# Patient Record
Sex: Female | Born: 1955 | Race: Black or African American | Hispanic: No | Marital: Married | Smoking: Never smoker
Health system: Southern US, Community
[De-identification: ages and names within clinical notes are randomized; demographics above are authoritative.]

## PROBLEM LIST (undated history)

## (undated) DIAGNOSIS — J45909 Unspecified asthma, uncomplicated: Secondary | ICD-10-CM

## (undated) DIAGNOSIS — A3790 Whooping cough, unspecified species without pneumonia: Secondary | ICD-10-CM

## (undated) HISTORY — DX: Whooping cough, unspecified species without pneumonia: A37.90

## (undated) HISTORY — DX: Unspecified asthma, uncomplicated: J45.909

---

## 1999-08-12 ENCOUNTER — Other Ambulatory Visit: Admission: RE | Admit: 1999-08-12 | Discharge: 1999-08-12 | Payer: Self-pay | Admitting: *Deleted

## 1999-08-30 ENCOUNTER — Encounter: Admission: RE | Admit: 1999-08-30 | Discharge: 1999-08-30 | Payer: Self-pay | Admitting: Family Medicine

## 1999-08-30 ENCOUNTER — Encounter: Payer: Self-pay | Admitting: Family Medicine

## 2000-10-12 ENCOUNTER — Emergency Department (HOSPITAL_COMMUNITY): Admission: EM | Admit: 2000-10-12 | Discharge: 2000-10-13 | Payer: Self-pay | Admitting: Emergency Medicine

## 2001-01-05 ENCOUNTER — Encounter: Admission: RE | Admit: 2001-01-05 | Discharge: 2001-01-05 | Payer: Self-pay | Admitting: Family Medicine

## 2001-01-05 ENCOUNTER — Encounter: Payer: Self-pay | Admitting: Family Medicine

## 2004-03-29 ENCOUNTER — Other Ambulatory Visit: Admission: RE | Admit: 2004-03-29 | Discharge: 2004-03-29 | Payer: Self-pay | Admitting: Family Medicine

## 2005-04-28 ENCOUNTER — Other Ambulatory Visit: Admission: RE | Admit: 2005-04-28 | Discharge: 2005-04-28 | Payer: Self-pay | Admitting: Family Medicine

## 2005-06-23 ENCOUNTER — Encounter: Admission: RE | Admit: 2005-06-23 | Discharge: 2005-06-23 | Payer: Self-pay | Admitting: Family Medicine

## 2005-12-23 ENCOUNTER — Encounter: Admission: RE | Admit: 2005-12-23 | Discharge: 2005-12-23 | Payer: Self-pay | Admitting: Family Medicine

## 2006-05-05 ENCOUNTER — Other Ambulatory Visit: Admission: RE | Admit: 2006-05-05 | Discharge: 2006-05-05 | Payer: Self-pay | Admitting: Family Medicine

## 2006-07-06 ENCOUNTER — Encounter: Admission: RE | Admit: 2006-07-06 | Discharge: 2006-07-06 | Payer: Self-pay | Admitting: Family Medicine

## 2006-07-15 ENCOUNTER — Emergency Department (HOSPITAL_COMMUNITY): Admission: EM | Admit: 2006-07-15 | Discharge: 2006-07-16 | Payer: Self-pay | Admitting: Emergency Medicine

## 2007-01-08 ENCOUNTER — Encounter: Admission: RE | Admit: 2007-01-08 | Discharge: 2007-01-08 | Payer: Self-pay | Admitting: Internal Medicine

## 2007-06-08 ENCOUNTER — Other Ambulatory Visit: Admission: RE | Admit: 2007-06-08 | Discharge: 2007-06-08 | Payer: Self-pay | Admitting: Family Medicine

## 2008-06-25 ENCOUNTER — Other Ambulatory Visit: Admission: RE | Admit: 2008-06-25 | Discharge: 2008-06-25 | Payer: Self-pay | Admitting: Family Medicine

## 2008-12-22 ENCOUNTER — Emergency Department (HOSPITAL_COMMUNITY): Admission: EM | Admit: 2008-12-22 | Discharge: 2008-12-22 | Payer: Self-pay | Admitting: Family Medicine

## 2009-07-30 ENCOUNTER — Other Ambulatory Visit: Admission: RE | Admit: 2009-07-30 | Discharge: 2009-07-30 | Payer: Self-pay | Admitting: Family Medicine

## 2010-09-21 ENCOUNTER — Other Ambulatory Visit (HOSPITAL_COMMUNITY)
Admission: RE | Admit: 2010-09-21 | Discharge: 2010-09-21 | Disposition: A | Discharge status: Home / Self Care | Payer: Self-pay | Source: Ambulatory Visit | Attending: Family Medicine | Admitting: Family Medicine

## 2010-09-21 ENCOUNTER — Other Ambulatory Visit: Payer: Self-pay | Admitting: Physician Assistant

## 2010-09-21 DIAGNOSIS — Z124 Encounter for screening for malignant neoplasm of cervix: Secondary | ICD-10-CM | POA: Insufficient documentation

## 2012-05-10 ENCOUNTER — Other Ambulatory Visit: Payer: Self-pay | Admitting: Family Medicine

## 2012-05-10 DIAGNOSIS — E041 Nontoxic single thyroid nodule: Secondary | ICD-10-CM

## 2012-05-14 ENCOUNTER — Ambulatory Visit
Admission: RE | Admit: 2012-05-14 | Discharge: 2012-05-14 | Disposition: A | Discharge status: Home / Self Care | Payer: 59 | Source: Ambulatory Visit | Attending: Family Medicine | Admitting: Family Medicine

## 2012-05-14 DIAGNOSIS — E041 Nontoxic single thyroid nodule: Secondary | ICD-10-CM

## 2012-05-25 ENCOUNTER — Other Ambulatory Visit: Payer: Self-pay | Admitting: Family Medicine

## 2012-05-25 DIAGNOSIS — R748 Abnormal levels of other serum enzymes: Secondary | ICD-10-CM

## 2012-05-28 ENCOUNTER — Ambulatory Visit
Admission: RE | Admit: 2012-05-28 | Discharge: 2012-05-28 | Disposition: A | Discharge status: Home / Self Care | Payer: 59 | Source: Ambulatory Visit | Attending: Family Medicine | Admitting: Family Medicine

## 2012-05-28 DIAGNOSIS — R748 Abnormal levels of other serum enzymes: Secondary | ICD-10-CM

## 2012-08-13 ENCOUNTER — Other Ambulatory Visit: Payer: Self-pay | Admitting: Family Medicine

## 2012-08-13 ENCOUNTER — Ambulatory Visit
Admission: RE | Admit: 2012-08-13 | Discharge: 2012-08-13 | Disposition: A | Discharge status: Home / Self Care | Payer: 59 | Source: Ambulatory Visit | Attending: Family Medicine | Admitting: Family Medicine

## 2012-08-13 DIAGNOSIS — R52 Pain, unspecified: Secondary | ICD-10-CM

## 2012-10-02 ENCOUNTER — Other Ambulatory Visit: Payer: Self-pay | Admitting: Gastroenterology

## 2012-10-02 DIAGNOSIS — R109 Unspecified abdominal pain: Secondary | ICD-10-CM

## 2012-10-05 ENCOUNTER — Ambulatory Visit
Admission: RE | Admit: 2012-10-05 | Discharge: 2012-10-05 | Disposition: A | Discharge status: Home / Self Care | Payer: 59 | Source: Ambulatory Visit | Attending: Gastroenterology | Admitting: Gastroenterology

## 2013-01-02 ENCOUNTER — Other Ambulatory Visit: Payer: Self-pay | Admitting: Family Medicine

## 2013-01-02 ENCOUNTER — Ambulatory Visit
Admission: RE | Admit: 2013-01-02 | Discharge: 2013-01-02 | Disposition: A | Discharge status: Home / Self Care | Payer: 59 | Source: Ambulatory Visit | Attending: Family Medicine | Admitting: Family Medicine

## 2013-01-02 DIAGNOSIS — R748 Abnormal levels of other serum enzymes: Secondary | ICD-10-CM

## 2013-12-31 ENCOUNTER — Other Ambulatory Visit (HOSPITAL_COMMUNITY)
Admission: RE | Admit: 2013-12-31 | Discharge: 2013-12-31 | Disposition: A | Discharge status: Home / Self Care | Payer: BC Managed Care – PPO | Source: Ambulatory Visit | Attending: Family Medicine | Admitting: Family Medicine

## 2013-12-31 ENCOUNTER — Other Ambulatory Visit: Payer: Self-pay | Admitting: Family Medicine

## 2013-12-31 DIAGNOSIS — Z124 Encounter for screening for malignant neoplasm of cervix: Secondary | ICD-10-CM | POA: Insufficient documentation

## 2013-12-31 DIAGNOSIS — E041 Nontoxic single thyroid nodule: Secondary | ICD-10-CM

## 2013-12-31 DIAGNOSIS — Z113 Encounter for screening for infections with a predominantly sexual mode of transmission: Secondary | ICD-10-CM | POA: Insufficient documentation

## 2013-12-31 DIAGNOSIS — Z1151 Encounter for screening for human papillomavirus (HPV): Secondary | ICD-10-CM | POA: Insufficient documentation

## 2014-01-06 ENCOUNTER — Other Ambulatory Visit: Payer: 59

## 2014-01-07 ENCOUNTER — Encounter (INDEPENDENT_AMBULATORY_CARE_PROVIDER_SITE_OTHER): Payer: Self-pay

## 2014-01-07 ENCOUNTER — Ambulatory Visit
Admission: RE | Admit: 2014-01-07 | Discharge: 2014-01-07 | Disposition: A | Discharge status: Home / Self Care | Payer: BC Managed Care – PPO | Source: Ambulatory Visit | Attending: Family Medicine | Admitting: Family Medicine

## 2014-01-07 DIAGNOSIS — E041 Nontoxic single thyroid nodule: Secondary | ICD-10-CM

## 2014-07-07 ENCOUNTER — Ambulatory Visit
Admission: RE | Admit: 2014-07-07 | Discharge: 2014-07-07 | Disposition: A | Discharge status: Home / Self Care | Payer: BC Managed Care – PPO | Source: Ambulatory Visit | Attending: Family Medicine | Admitting: Family Medicine

## 2014-07-07 ENCOUNTER — Other Ambulatory Visit: Payer: Self-pay | Admitting: Family Medicine

## 2014-07-07 DIAGNOSIS — R05 Cough: Secondary | ICD-10-CM

## 2014-07-07 DIAGNOSIS — R059 Cough, unspecified: Secondary | ICD-10-CM

## 2015-01-07 ENCOUNTER — Other Ambulatory Visit: Payer: Self-pay | Admitting: Family Medicine

## 2015-01-07 ENCOUNTER — Other Ambulatory Visit (HOSPITAL_COMMUNITY)
Admission: RE | Admit: 2015-01-07 | Discharge: 2015-01-07 | Disposition: A | Discharge status: Home / Self Care | Payer: BLUE CROSS/BLUE SHIELD | Source: Ambulatory Visit | Attending: Family Medicine | Admitting: Family Medicine

## 2015-01-07 DIAGNOSIS — Z124 Encounter for screening for malignant neoplasm of cervix: Secondary | ICD-10-CM | POA: Diagnosis not present

## 2015-01-07 DIAGNOSIS — Z1151 Encounter for screening for human papillomavirus (HPV): Secondary | ICD-10-CM | POA: Insufficient documentation

## 2015-01-07 DIAGNOSIS — Z113 Encounter for screening for infections with a predominantly sexual mode of transmission: Secondary | ICD-10-CM | POA: Diagnosis present

## 2015-01-09 LAB — CYTOLOGY - PAP

## 2016-09-08 DIAGNOSIS — J069 Acute upper respiratory infection, unspecified: Secondary | ICD-10-CM | POA: Diagnosis not present

## 2016-09-08 DIAGNOSIS — E11319 Type 2 diabetes mellitus with unspecified diabetic retinopathy without macular edema: Secondary | ICD-10-CM | POA: Diagnosis not present

## 2016-09-08 DIAGNOSIS — E782 Mixed hyperlipidemia: Secondary | ICD-10-CM | POA: Diagnosis not present

## 2016-11-23 DIAGNOSIS — Z1231 Encounter for screening mammogram for malignant neoplasm of breast: Secondary | ICD-10-CM | POA: Diagnosis not present

## 2016-12-07 DIAGNOSIS — E782 Mixed hyperlipidemia: Secondary | ICD-10-CM | POA: Diagnosis not present

## 2017-03-13 DIAGNOSIS — J069 Acute upper respiratory infection, unspecified: Secondary | ICD-10-CM | POA: Diagnosis not present

## 2017-04-07 ENCOUNTER — Other Ambulatory Visit: Payer: Self-pay | Admitting: Family Medicine

## 2017-04-07 ENCOUNTER — Ambulatory Visit
Admission: RE | Admit: 2017-04-07 | Discharge: 2017-04-07 | Disposition: A | Discharge status: Home / Self Care | Payer: 59 | Source: Ambulatory Visit | Attending: Family Medicine | Admitting: Family Medicine

## 2017-04-07 DIAGNOSIS — R05 Cough: Secondary | ICD-10-CM

## 2017-04-07 DIAGNOSIS — Z7984 Long term (current) use of oral hypoglycemic drugs: Secondary | ICD-10-CM | POA: Diagnosis not present

## 2017-04-07 DIAGNOSIS — R059 Cough, unspecified: Secondary | ICD-10-CM

## 2017-04-07 DIAGNOSIS — I110 Hypertensive heart disease with heart failure: Secondary | ICD-10-CM | POA: Diagnosis not present

## 2017-04-07 DIAGNOSIS — R1310 Dysphagia, unspecified: Secondary | ICD-10-CM

## 2017-04-07 DIAGNOSIS — Z0000 Encounter for general adult medical examination without abnormal findings: Secondary | ICD-10-CM | POA: Diagnosis not present

## 2017-04-07 DIAGNOSIS — E113213 Type 2 diabetes mellitus with mild nonproliferative diabetic retinopathy with macular edema, bilateral: Secondary | ICD-10-CM | POA: Diagnosis not present

## 2017-04-14 ENCOUNTER — Ambulatory Visit
Admission: RE | Admit: 2017-04-14 | Discharge: 2017-04-14 | Disposition: A | Discharge status: Home / Self Care | Payer: 59 | Source: Ambulatory Visit | Attending: Family Medicine | Admitting: Family Medicine

## 2017-04-14 DIAGNOSIS — R1310 Dysphagia, unspecified: Secondary | ICD-10-CM | POA: Diagnosis not present

## 2017-04-24 DIAGNOSIS — K222 Esophageal obstruction: Secondary | ICD-10-CM | POA: Diagnosis not present

## 2017-04-24 DIAGNOSIS — R933 Abnormal findings on diagnostic imaging of other parts of digestive tract: Secondary | ICD-10-CM | POA: Diagnosis not present

## 2017-04-24 DIAGNOSIS — R1310 Dysphagia, unspecified: Secondary | ICD-10-CM | POA: Diagnosis not present

## 2017-05-10 ENCOUNTER — Telehealth: Payer: Self-pay | Admitting: Pulmonary Disease

## 2017-05-10 NOTE — Telephone Encounter (Signed)
Margie----[facility] called and stated that they are faxing over the records that you requested for this pt. Just FYI

## 2017-05-11 ENCOUNTER — Ambulatory Visit (INDEPENDENT_AMBULATORY_CARE_PROVIDER_SITE_OTHER): Payer: 59 | Admitting: Pulmonary Disease

## 2017-05-11 ENCOUNTER — Encounter: Payer: Self-pay | Admitting: Pulmonary Disease

## 2017-05-11 ENCOUNTER — Ambulatory Visit (INDEPENDENT_AMBULATORY_CARE_PROVIDER_SITE_OTHER)
Admission: RE | Admit: 2017-05-11 | Discharge: 2017-05-11 | Disposition: A | Discharge status: Home / Self Care | Payer: 59 | Source: Ambulatory Visit | Attending: Pulmonary Disease | Admitting: Pulmonary Disease

## 2017-05-11 VITALS — BP 130/80 | HR 83 | Ht 63.5 in | Wt 195.6 lb

## 2017-05-11 DIAGNOSIS — R059 Cough, unspecified: Secondary | ICD-10-CM

## 2017-05-11 DIAGNOSIS — R05 Cough: Secondary | ICD-10-CM | POA: Diagnosis not present

## 2017-05-11 DIAGNOSIS — Z23 Encounter for immunization: Secondary | ICD-10-CM | POA: Diagnosis not present

## 2017-05-11 LAB — NITRIC OXIDE: NITRIC OXIDE: 8

## 2017-05-11 MED ORDER — CHLORPHENIRAMINE MALEATE 4 MG PO TABS: 8.0000 mg | ORAL_TABLET | Freq: Three times a day (TID) | ORAL | 2 refills | Status: DC

## 2017-05-11 MED ORDER — AZELASTINE HCL 0.1 % NA SOLN: 2.0000 | Freq: Two times a day (BID) | NASAL | 6 refills | Status: DC

## 2017-05-11 NOTE — Patient Instructions (Addendum)
We'll start chlorpheniramine 8 mg 3 times daily and Astelin nasal spray Continue Protonix and follow up with GI for EGD Continue the Symbicort. You need to take 2 puffs 2 times daily for it to have maximum affect  We'll get a chest x-ray and pulmonary function tests for further evaluation of her lung function Follow-up in 6-8 weeks.

## 2017-05-11 NOTE — Progress Notes (Signed)
TKAI LARGE    [id number]    01/24/1956  Primary Care Physician:Shaw, Rockney Ghee, MD  Referring Physician: Lupita Raider, MD [address] [place], [place] [address]   Chief complaint:  Consult for evaluation of cough  HPI: 61 year old with past medical history of hypertension, hyperlipidemia, diabetes. She has complains of cough for the past 2-3 years. This is nonproductive in nature and is most evident while eating. Denies any dyspnea, wheezing.she has symptoms of allergic rhinitis with postnasal drip, GERD. She had a recent evaluation with barium swallow which showed stricture and is scheduled for EGD with [facility] next week. Started on Protonix for her GERD She is currently on Symbicort which she uses as needed but does not know if this has improved her breathing.   Pets: none Occupation:Shift manager at [employer]  Exposures:no known exposures  Smoking history:None Travel History:No recent travel   Outpatient Encounter Prescriptions as of 05/11/2017  Medication Sig  . amLODipine (NORVASC) 10 MG tablet Take 10 mg by mouth daily.  Marland Kitchen atorvastatin (LIPITOR) 80 MG tablet Take 80 mg by mouth daily.  . budesonide-formoterol (SYMBICORT) 160-4.5 MCG/ACT inhaler Inhale 2 puffs into the lungs 2 (two) times daily.  . metFORMIN (GLUCOPHAGE) 1000 MG tablet Take 1,000 mg by mouth 2 (two) times daily with a meal.   No facility-administered encounter medications on file as of 05/11/2017.     Allergies as of 05/11/2017  . (Not on File)    Past Medical History:  Diagnosis Date  . Asthma   . Whooping cough     History reviewed. No pertinent surgical history.  Family History  Problem Relation Age of Onset  . Hypertension Mother   . Hypertension Father   . Hypertension Sister     Social History   Social History  . Marital status: Married    Spouse name: N/A  . Number of children: N/A  . Years of education: N/A   Occupational History  . Not on  file.   Social History Main Topics  . Smoking status: Never Smoker  . Smokeless tobacco: Never Used  . Alcohol use No  . Drug use: No  . Sexual activity: Not on file   Other Topics Concern  . Not on file   Social History Narrative  . No narrative on file   Review of systems: Review of Systems  Constitutional: Negative for fever and chills.  HENT: Negative.   Eyes: Negative for blurred vision.  Respiratory: as per HPI  Cardiovascular: Negative for chest pain and palpitations.  Gastrointestinal: Negative for vomiting, diarrhea, blood per rectum. Genitourinary: Negative for dysuria, urgency, frequency and hematuria.  Musculoskeletal: Negative for myalgias, back pain and joint pain.  Skin: Negative for itching and rash.  Neurological: Negative for dizziness, tremors, focal weakness, seizures and loss of consciousness.  Endo/Heme/Allergies: Negative for environmental allergies.  Psychiatric/Behavioral: Negative for depression, suicidal ideas and hallucinations.  All other systems reviewed and are negative.  Physical Exam: Blood pressure 130/80, pulse 83, height 5' 3.5" (1.613 m), weight 88.7 kg (195 lb 9.6 oz), SpO2 98 %. Gen:      No acute distress HEENT:  EOMI, sclera anicteric Neck:     No masses; no thyromegaly Lungs:    Clear to auscultation bilaterally; normal respiratory effort CV:         Regular rate and rhythm; no murmurs Abd:      + bowel sounds; soft, non-tender; no palpable masses,  no distension Ext:    No edema; adequate peripheral perfusion Skin:      Warm and dry; no rash Neuro: alert and oriented x 3 Psych: normal mood and affect  Data Reviewed: Chest x-ray 04/07/17-no acute cardiopulmonary abnormality.  Barium swallow 04/14/17- Stasis of 13 mm barium tablet in the distal esophagus, compatible with stricture. No abrupt shouldering or mucosal irregularity seen on single or double contrast imaging to raise concern for neoplasm. Upper endoscopy may prove  helpful to further evaluate.  Tiny Sliding type hiatal hernia.  FENO- 8 Assessment:  Evaluation for chronic cough Suspect upper airway cough from GERD and esophageal stricture. She may have a component of postnasal drip, rhinitis. Suspicion for asthma as low as symptoms are not very typical and FENO is normal  She'll continue on the Symbicort. I have asked her to take 2 puffs twice daily for maximum effect. Evaluate with chest x-ray, PFTs with bronchial dilator response. For postnasal drip start chlorpheniramine antihistamine 8 mg 3 times daily and Astelin nasal spray For GERD and esophageal stricture she will continue protonix and has follow up with GI. I educated her on behavioral changes to deal with cough including conscious suppression of the urge to cough, use of throat lozenges.  Plan/Recommendations: - Continue Symbicort. Use 2 puffs twice daily - Check chest x-ray, PFTs - Start chlorpheniramine 8 mg 3 times daily and Astelin nasal spray - Continue Protonix, follow up with GI  Chilton Greathouse MD [facility] Pager [phone] 05/11/2017, 4:34 PM

## 2017-05-11 NOTE — Telephone Encounter (Addendum)
It does not appear that records were received. I have spoken with karen at [facility] and requested that records be re faxed.

## 2017-05-12 DIAGNOSIS — Z23 Encounter for immunization: Secondary | ICD-10-CM

## 2017-05-12 DIAGNOSIS — R05 Cough: Secondary | ICD-10-CM | POA: Diagnosis not present

## 2017-05-19 DIAGNOSIS — K2950 Unspecified chronic gastritis without bleeding: Secondary | ICD-10-CM | POA: Diagnosis not present

## 2017-05-19 DIAGNOSIS — R1310 Dysphagia, unspecified: Secondary | ICD-10-CM | POA: Diagnosis not present

## 2017-05-19 DIAGNOSIS — R933 Abnormal findings on diagnostic imaging of other parts of digestive tract: Secondary | ICD-10-CM | POA: Diagnosis not present

## 2017-06-08 ENCOUNTER — Ambulatory Visit (INDEPENDENT_AMBULATORY_CARE_PROVIDER_SITE_OTHER): Payer: 59 | Admitting: Pulmonary Disease

## 2017-06-08 ENCOUNTER — Encounter: Payer: Self-pay | Admitting: Pulmonary Disease

## 2017-06-08 VITALS — BP 132/78 | HR 75 | Ht 63.5 in | Wt 194.0 lb

## 2017-06-08 DIAGNOSIS — R059 Cough, unspecified: Secondary | ICD-10-CM

## 2017-06-08 DIAGNOSIS — R05 Cough: Secondary | ICD-10-CM | POA: Diagnosis not present

## 2017-06-08 LAB — PULMONARY FUNCTION TEST
DL/VA % pred: 105 %
DL/VA: 4.93 ml/min/mmHg/L
DLCO cor % pred: 66 %
DLCO cor: 15.13 ml/min/mmHg
DLCO unc % pred: 67 %
DLCO unc: 15.4 ml/min/mmHg
FEF 25-75 PRE: 3.17 L/s
FEF 25-75 Post: 1.58 L/sec
FEF2575-%CHANGE-POST: -50 %
FEF2575-%Pred-Post: 77 %
FEF2575-%Pred-Pre: 156 %
FEV1-%CHANGE-POST: -16 %
FEV1-%PRED-PRE: 89 %
FEV1-%Pred-Post: 74 %
FEV1-POST: 1.51 L
FEV1-PRE: 1.8 L
FEV1FVC-%Change-Post: -5 %
FEV1FVC-%Pred-Pre: 116 %
FEV6-%Change-Post: -11 %
FEV6-%PRED-PRE: 77 %
FEV6-%Pred-Post: 68 %
FEV6-Post: 1.7 L
FEV6-Pre: 1.92 L
FEV6FVC-%PRED-PRE: 104 %
FEV6FVC-%Pred-Post: 104 %
FVC-%Change-Post: -11 %
FVC-%PRED-POST: 66 %
FVC-%Pred-Pre: 75 %
FVC-Post: 1.7 L
FVC-Pre: 1.93 L
POST FEV1/FVC RATIO: 89 %
PRE FEV6/FVC RATIO: 100 %
Post FEV6/FVC ratio: 100 %
Pre FEV1/FVC ratio: 94 %
RV % PRED: 59 %
RV: 1.14 L
TLC % pred: 76 %
TLC: 3.74 L

## 2017-06-08 MED ORDER — ALBUTEROL SULFATE HFA 108 (90 BASE) MCG/ACT IN AERS: 2.0000 | INHALATION_SPRAY | Freq: Four times a day (QID) | RESPIRATORY_TRACT | 2 refills | Status: DC | PRN

## 2017-06-08 NOTE — Patient Instructions (Signed)
PFT done today. 

## 2017-06-08 NOTE — Progress Notes (Signed)
Megan Nicholson    [id number]    11/25/1955  Primary Care Physician:Shaw, Rockney Ghee, MD  Referring Physician: Lupita Raider, MD [address] [place], [place] [address]   Chief complaint: Follow-up for cough  HPI: 61 year old with past medical history of hypertension, hyperlipidemia, diabetes. She has complains of cough for the past 2-3 years. This is nonproductive in nature and is most evident while eating. Denies any dyspnea, wheezing.she has symptoms of allergic rhinitis with postnasal drip, GERD. She had a recent evaluation with barium swallow which showed stricture.  She had an EGD by [facility] this month which showed benign esophageal stricture with gastritis.  She underwent dilation and is on omeprazole for acid suppression. Continues to use the Symbicort which she takes as needed.  She does not know if this is helping with her cough.  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 amLODipine (NORVASC) 10 MG tablet Take 10 mg by mouth daily.  Marland Kitchen atorvastatin (LIPITOR) 80 MG tablet Take 80 mg by mouth daily.  Marland Kitchen azelastine (ASTELIN) 0.1 % nasal spray Place 2 sprays into both nostrils 2 (two) times daily. Use in each nostril as directed  . budesonide-formoterol (SYMBICORT) 160-4.5 MCG/ACT inhaler Inhale 2 puffs into the lungs 2 (two) times daily.  . chlorpheniramine (CHLOR-TRIMETON) 4 MG tablet Take 2 tablets (8 mg total) by mouth 3 (three) times daily.  . metFORMIN (GLUCOPHAGE) 1000 MG tablet Take 1,000 mg by mouth 2 (two) times daily with a meal.   No facility-administered encounter medications on file as of 06/08/2017.     Allergies as of 06/08/2017  . (Not on File)    Past Medical History:  Diagnosis Date  . Asthma   . Whooping cough     No past surgical history on file.  Family  History  Problem Relation Age of Onset  . Hypertension Mother   . Hypertension Father   . Hypertension Sister     Social History   Social History  . Marital status: Married    Spouse name: N/A  . Number of children: N/A  . Years of education: N/A   Occupational History  . Not on file.   Social History Main Topics  . Smoking status: Never Smoker  . Smokeless tobacco: Never Used  . Alcohol use No  . Drug use: No  . Sexual activity: Not on file   Other Topics Concern  . Not on file   Social History Narrative  . No narrative on file   Review of systems: Review of Systems  Constitutional: Negative for fever and chills.  HENT: Negative.   Eyes: Negative for blurred vision.  Respiratory: as per HPI  Cardiovascular: Negative for chest pain and palpitations.  Gastrointestinal: Negative for vomiting, diarrhea, blood per rectum. Genitourinary: Negative for dysuria, urgency, frequency and hematuria.  Musculoskeletal: Negative for myalgias, back pain and joint pain.  Skin: Negative for itching and rash.  Neurological: Negative for dizziness, tremors, focal weakness, seizures and loss of consciousness.  Endo/Heme/Allergies: Negative for environmental allergies.  Psychiatric/Behavioral: Negative for depression, suicidal ideas and hallucinations.  All other systems reviewed and are negative.  Physical Exam: Blood pressure 132/78, pulse 75, height 5' 3.5" (1.613 m), weight 194 lb (88 kg), SpO2 95 %. Gen:      No acute distress HEENT:  EOMI,  sclera anicteric Neck:     No masses; no thyromegaly Lungs:    Clear to auscultation bilaterally; normal respiratory effort CV:         Regular rate and rhythm; no murmurs Abd:      + bowel sounds; soft, non-tender; no palpable masses, no distension Ext:    No edema; adequate peripheral perfusion Skin:      Warm and dry; no rash Neuro: alert and oriented x 3 Psych: normal mood and affect  Data Reviewed: Chest x-ray 04/07/17-no acute  cardiopulmonary abnormality.   Chest x-ray 05/11/17-no acute cardiopulmonary abnormality Reviewed images personally  Barium swallow 04/14/17- Stasis of 13 mm barium tablet in the distal esophagus, compatible with stricture. No abrupt shouldering or mucosal irregularity seen on single or double contrast imaging to raise concern for neoplasm. Upper endoscopy may prove helpful to further evaluate.  Tiny Sliding type hiatal hernia.  FENO 05/11/17- 8  EGD 05/19/17 Normal larynx, hiatal hernia Benign esophageal stenosis-dilated, chronic gastritis  PFTs 06/08/17 FVC 1.7 [6 6%], FEV1 1.51 [94%], F/F 89, TLC 76%, DLCO 67%, DLCO/VA 105% Minimal restriction, reduced diffusion capacity which corrects for alveolar volume  Assessment:  Evaluation for chronic cough Suspect upper airway cough from GERD and esophageal stricture. She may have a component of postnasal drip, rhinitis. Suspicion for asthma as low as symptoms are not very typical and FENO is normal. PFTs reviewed.  There is no evidence of obstruction or bronchodilator response.  There was minimal restriction and diffusion impairment that corrects for alveolar volume.  I suspect this is secondary to her body habitus.  There is no evidence of interstitial lung disease on chest x-ray  I do not believe she needs to be on Symbicort.  Will stop that and give her albuterol inhaler to be used PRN For postnasal drip continue chlorpheniramine antihistamine 8 mg 3 times daily and Astelin nasal spray For GERD and esophageal stricture she will continue protonix and has follow up with GI. I educated her on behavioral changes to deal with cough including conscious suppression of the urge to cough, use of throat lozenges.  Plan/Recommendations: - Stop Symbicort, albuterol as needed - Chlorphentermine, Astelin nasal spray - Continue Protonix, follow up with GI  Chilton GreathousePraveen Emersynn Deatley MD [facility] Pager [phone] 06/08/2017, 2:08  PM

## 2017-06-08 NOTE — Patient Instructions (Addendum)
We will stop the Symbicort Will start you on albuterol inhaler as needed Continue on Astelin and chlorphentermine Return in 6 months.

## 2017-07-05 DIAGNOSIS — H2513 Age-related nuclear cataract, bilateral: Secondary | ICD-10-CM | POA: Diagnosis not present

## 2017-07-05 DIAGNOSIS — E119 Type 2 diabetes mellitus without complications: Secondary | ICD-10-CM | POA: Diagnosis not present

## 2017-07-06 DIAGNOSIS — R1310 Dysphagia, unspecified: Secondary | ICD-10-CM | POA: Diagnosis not present

## 2017-07-13 ENCOUNTER — Other Ambulatory Visit: Payer: Self-pay

## 2017-07-13 ENCOUNTER — Ambulatory Visit (INDEPENDENT_AMBULATORY_CARE_PROVIDER_SITE_OTHER): Payer: 59

## 2017-07-13 ENCOUNTER — Ambulatory Visit (INDEPENDENT_AMBULATORY_CARE_PROVIDER_SITE_OTHER): Payer: 59 | Admitting: Podiatry

## 2017-07-13 ENCOUNTER — Encounter: Payer: Self-pay | Admitting: Podiatry

## 2017-07-13 DIAGNOSIS — M722 Plantar fascial fibromatosis: Secondary | ICD-10-CM | POA: Diagnosis not present

## 2017-07-13 DIAGNOSIS — M779 Enthesopathy, unspecified: Secondary | ICD-10-CM

## 2017-07-13 MED ORDER — IBUPROFEN 600 MG PO TABS: 600.0000 mg | ORAL_TABLET | Freq: Three times a day (TID) | ORAL | 0 refills | Status: DC | PRN

## 2017-07-13 NOTE — Patient Instructions (Signed)

## 2017-07-13 NOTE — Progress Notes (Signed)
Subjective:    Patient ID: Megan Nicholson, female    DOB: 10/07/1955, 61 y.o.   MRN: [id number]  HPI  Chief Complaint  Patient presents with  . Foot Pain    B/L bottom of heel pain and Left dorsal of foot x 3-4 weeks   Megan Nicholson presents the office today for concerns of pain to the bottom of her heel with a right side more than the left as well as pain in the top of the left foot which is been ongoing for 3-4 weeks of the heel pain is been ongoing for longer.  She denies any numbness or tingling.  She describes a sharp pain in the bottom of her heel on the right side.  She has minimal pain to the left side of the heel.  She has taken 800 mg ibuprofen without any significant improvement.  She is allergic to meloxicam but she can take other anti-inflammatories without issue.  The pain does not wake her up at night to her feet.  The pain in her heels worse in the morning when she gets up or after periods of rest but also after walking all day.  She is also been soaking her feet and taken Tylenol without much help.  She has no other concerns today.  She denies any claudication symptoms.     Review of Systems  All other systems reviewed and are negative.  Past Medical History:  Diagnosis Date  . Asthma   . Whooping cough     No past surgical history on file.   Current Outpatient Medications:  .  albuterol (PROVENTIL HFA;VENTOLIN HFA) 108 (90 Base) MCG/ACT inhaler, Inhale 2 puffs into the lungs every 6 (six) hours as needed for wheezing or shortness of breath., Disp: 1 Inhaler, Rfl: 2 .  amLODipine (NORVASC) 10 MG tablet, Take 10 mg by mouth daily., Disp: , Rfl: 2 .  atorvastatin (LIPITOR) 80 MG tablet, Take 80 mg by mouth daily., Disp: , Rfl: 2 .  azelastine (ASTELIN) 0.1 % nasal spray, Place 2 sprays into both nostrils 2 (two) times daily. Use in each nostril as directed, Disp: 30 mL, Rfl: 6 .  chlorpheniramine (CHLOR-TRIMETON) 4 MG tablet, Take 2 tablets (8 mg total) by mouth 3  (three) times daily., Disp: 120 tablet, Rfl: 2 .  metFORMIN (GLUCOPHAGE) 1000 MG tablet, Take 1,000 mg by mouth 2 (two) times daily with a meal., Disp: , Rfl:  .  ibuprofen (ADVIL,MOTRIN) 600 MG tablet, Take 1 tablet (600 mg total) by mouth every 8 (eight) hours as needed., Disp: 30 tablet, Rfl: 0  Allergies  Allergen Reactions  . Meloxicam Hives    Social History   Socioeconomic History  . Marital status: Married    Spouse name: Not on file  . Number of children: Not on file  . Years of education: Not on file  . Highest education level: Not on file  Social Needs  . Financial resource strain: Not on file  . Food insecurity - worry: Not on file  . Food insecurity - inability: Not on file  . Transportation needs - medical: Not on file  . Transportation needs - non-medical: Not on file  Occupational History  . Not on file  Tobacco Use  . Smoking status: Never Smoker  . Smokeless tobacco: Never Used  Substance and Sexual Activity  . Alcohol use: No  . Drug use: No  . Sexual activity: Not on file  Other Topics Concern  . Not  on file  Social History Narrative  . Not on file         Objective:   Physical Exam  General: AAO x3, NAD  Dermatological: Skin is warm, dry and supple bilateral. Nails x 10 are well manicured; remaining integument appears unremarkable at this time. There are no open sores, no preulcerative lesions, no rash or signs of infection present.  Vascular: Dorsalis Pedis artery and Posterior Tibial artery pedal pulses are 2/4 bilateral with immedate capillary fill time. Pedal hair growth present. There is no pain with calf compression, swelling, warmth, erythema.   Neruologic: Grossly intact via light touch bilateral. Vibratory intact via tuning fork bilateral. Protective threshold with Semmes Wienstein monofilament intact to all pedal sites bilateral.  Negative Tinel sign is present.  Musculoskeletal:Tenderness to palpation along the plantar medial  tubercle of the calcaneus at the insertion of plantar fascia on the right there is no significant tenderness palpation of the left plantar heel on the plantar fascia.  Foot. There is no pain along the course of the plantar fascia within the arch of the foot. Plantar fascia appears to be intact. There is no pain with lateral compression of the calcaneus or pain with vibratory sensation. There is no pain along the course or insertion of the achilles tendon.  Mild discomfort to the left foot along the midfoot on the Lisfranc joint.  There is no area pinpoint tenderness to this but there is no overlying edema, erythema, increase in warmth.  No other areas of tenderness to bilateral lower extremities. Muscular strength 5/5 in all groups tested bilateral.  Gait: Unassisted, Nonantalgic.      Assessment & Plan:  61 year old female with left dorsal midfoot pain, right heel pain likely plantar fasciitis -Treatment options discussed including all alternatives, risks, and complications -Etiology of symptoms were discussed -X-rays were obtained and reviewed with the patient.  No evidence of acute fracture or stress fracture identified.  Hammertoes are present bilaterally as well as calcaneal spurring.  Arthritic changes of the first MPJ in the left foot.  Right heel pain, plantar fasciitis.  -Injection was performed.  See procedure below. -Ibuprofen prescribed. -Stretching, icing exercises daily -Plantar fascial brace was dispensed -Discussed shoe gear modifications and orthotics  Procedure: Injection Tendon/Ligament Discussed alternatives, risks, complications and verbal consent was obtained.  Location: Right plantar fascia at the glabrous junction; medial approach. Skin Prep: Alcohol. Injectate: 1 cc 0.5% marcaine plain, 1 cc 0.5% Marcaine plain and, 1 cc kenalog 10. Disposition: Patient tolerated procedure well. Injection site dressed with a band-aid.  Post-injection care was discussed and return  precautions discussed.    Left midfoot pain; likely capsulitis -Ibuprofen -Discussed supportive shoes and inserts as well for this.

## 2017-07-17 DIAGNOSIS — M722 Plantar fascial fibromatosis: Secondary | ICD-10-CM | POA: Insufficient documentation

## 2017-08-03 ENCOUNTER — Telehealth: Payer: Self-pay | Admitting: Podiatry

## 2017-08-03 ENCOUNTER — Ambulatory Visit (INDEPENDENT_AMBULATORY_CARE_PROVIDER_SITE_OTHER): Payer: 59 | Admitting: Podiatry

## 2017-08-03 DIAGNOSIS — M722 Plantar fascial fibromatosis: Secondary | ICD-10-CM

## 2017-08-03 NOTE — Telephone Encounter (Signed)
Left message for pt to call me back to discuss orthotic coverage. °

## 2017-08-03 NOTE — Telephone Encounter (Signed)
Pt returned call and is going to discuss the cost (300.00) with husband and will call me back.

## 2017-08-04 NOTE — Telephone Encounter (Signed)
Pt called back and would like to proceed with orthotics at 300.00. Is wanting to set up payment plan

## 2017-08-07 NOTE — Progress Notes (Signed)
Subjective: Yong ChannelGennies M Ashland presents to the office today for follow-up evaluation of right heel pain and left midfoot pain. They state that they are doing better but still having some pain. They have been icing, stretching, try to wear supportive shoe as much as possible. She has been wearing the plantar fascial brace and taking ibuprofen and icing. She denies any recent injury or trauma.  No other complaints at this time. No acute changes since last appointment. They deny any systemic complaints such as fevers, chills, nausea, vomiting.  Objective: General: AAO x3, NAD  Dermatological: Skin is warm, dry and supple bilateral. Nails x 10 are well manicured; remaining integument appears unremarkable at this time. There are no open sores, no preulcerative lesions, no rash or signs of infection present.  Vascular: Dorsalis Pedis artery and Posterior Tibial artery pedal pulses are 2/4 bilateral with immedate capillary fill time. Pedal hair growth present. There is no pain with calf compression, swelling, warmth, erythema.   Neruologic: Grossly intact via light touch bilateral.   Musculoskeletal: There is improved but still some continued tenderness palpation along the plantar medial tubercle of the calcaneus at the insertion of the plantar fascia on the right foot. There is no pain along the course of the plantar fascia within the arch of the foot. Plantar fascia appears to be intact bilaterally. There is no pain with lateral compression of the calcaneu. There is no pain along the course or insertion of the Achilles tendon. There are no other areas of tenderness to bilateral lower extremities. No pain, crepitus, or limitation noted with foot and ankle range of motion bilateral. Muscular strength 5/5 in all groups tested bilateral.  Gait: Unassisted, Nonantalgic.   Assessment: Presents for follow-up evaluation for heel pain, likely plantar fasciitis   Plan: -Treatment options discussed including all  alternatives, risks, and complications -A 2nd steroid injection was preformed today. See below.  -Ice and stretching exercises on a daily basis. -Continue supportive shoe gear. -Discussed orthotics. She was measured today for them.  -Follow-up in 4 weeks  or sooner if any problems arise. In the meantime, encouraged to call the office with any questions, concerns, change in symptoms.   Procedure: Injection Tendon/Ligament Discussed alternatives, risks, complications and verbal consent was obtained.  Location: Right plantar fascia at the glabrous junction; medial approach. Skin Prep: Alcohol. Injectate: 1 cc 0.5% marcaine plain, 1 cc 0.5% Marcaine plain and, 1 cc kenalog 10. Disposition: Patient tolerated procedure well. Injection site dressed with a band-aid.  Post-injection care was discussed and return precautions discussed.    Ovid CurdMatthew Wagoner, DPM

## 2017-08-31 ENCOUNTER — Encounter: Payer: Self-pay | Admitting: Podiatry

## 2017-08-31 ENCOUNTER — Ambulatory Visit (INDEPENDENT_AMBULATORY_CARE_PROVIDER_SITE_OTHER): Payer: 59 | Admitting: Podiatry

## 2017-08-31 ENCOUNTER — Ambulatory Visit: Payer: 59 | Admitting: Orthotics

## 2017-08-31 DIAGNOSIS — M722 Plantar fascial fibromatosis: Secondary | ICD-10-CM

## 2017-08-31 NOTE — Progress Notes (Signed)
Patient came in today to pick up custom made foot orthotics.  The goals were accomplished and the patient reported no dissatisfaction with said orthotics.  Patient was advised of breakin period and how to report any issues. 

## 2017-09-03 NOTE — Progress Notes (Signed)
Subjective: Megan Nicholson presents to the office today for follow-up evaluation of right heel pain and left midfoot pain.  There is a left foot pain is very minimal and is intermittent in nature.  She says the right heel is doing much better however she did start to get some recurrence of pain over the last couple of days. She denies any recent injury or trauma.  No other complaints at this time. No acute changes since last appointment. They deny any systemic complaints such as fevers, chills, nausea, vomiting.  Objective: General: AAO x3, NAD  Dermatological: Skin is warm, dry and supple bilateral. Nails x 10 are well manicured; remaining integument appears unremarkable at this time. There are no open sores, no preulcerative lesions, no rash or signs of infection present.  Vascular: Dorsalis Pedis artery and Posterior Tibial artery pedal pulses are 2/4 bilateral with immedate capillary fill time. Pedal hair growth present. There is no pain with calf compression, swelling, warmth, erythema.   Neruologic: Grossly intact via light touch bilateral.   Musculoskeletal: There is mild continued tenderness palpation along the plantar medial tubercle of the calcaneus at the insertion of the plantar fascia on the right foot. There is no pain along the course of the plantar fascia within the arch of the foot. Plantar fascia appears to be intact bilaterally. There is no pain with lateral compression of the calcaneu. There is no pain along the course or insertion of the Achilles tendon.  At this time there is no tenderness palpation of the left dorsal midfoot or other areas of the left foot.  There are no other areas of tenderness to bilateral lower extremities. No pain, crepitus, or limitation noted with foot and ankle range of motion bilateral. Muscular strength 5/5 in all groups tested bilateral.  Gait: Unassisted, Nonantalgic.   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 See below.  -Ice and stretching exercises on a daily basis. -Continue supportive shoe gear. -Orthotics were dispensed today by Raiford Nobleick. -Follow-up in 4 weeks  or sooner if any problems arise. In the meantime, encouraged to call the office with any questions, concerns, change in symptoms.   Procedure: Injection Tendon/Ligament Discussed alternatives, risks, complications and verbal consent was obtained.  Location: Right plantar fascia at the glabrous junction; lateral approach. Skin Prep: Alcohol. Injectate: 1 cc 0.5% marcaine plain, 1 cc 0.5% Marcaine plain and, 1 cc kenalog 10. Disposition: Patient tolerated procedure well. Injection site dressed with a band-aid.  Post-injection care was discussed and return precautions discussed.    Ovid CurdMatthew Wagoner, DPM

## 2017-09-06 ENCOUNTER — Ambulatory Visit: Payer: 59 | Admitting: Orthotics

## 2017-09-06 DIAGNOSIS — M722 Plantar fascial fibromatosis: Secondary | ICD-10-CM

## 2017-09-06 NOTE — Progress Notes (Signed)
Trimmed 1/16" EVA from bottom of f/o, and skived heel EVA to fit better in shoes. Patient was happy.

## 2017-09-27 ENCOUNTER — Telehealth: Payer: Self-pay | Admitting: Podiatry

## 2017-09-27 ENCOUNTER — Other Ambulatory Visit: Payer: Self-pay | Admitting: Podiatry

## 2017-09-27 MED ORDER — IBUPROFEN 600 MG PO TABS: 600.0000 mg | ORAL_TABLET | Freq: Three times a day (TID) | ORAL | 0 refills | Status: DC | PRN

## 2017-09-27 NOTE — Telephone Encounter (Signed)
She would like a refill on the Ibuprofen 600mg . Please send it to [facility] on [address]

## 2017-09-27 NOTE — Telephone Encounter (Signed)
Done

## 2017-09-29 ENCOUNTER — Ambulatory Visit: Payer: 59 | Admitting: Podiatry

## 2017-10-05 DIAGNOSIS — E11319 Type 2 diabetes mellitus with unspecified diabetic retinopathy without macular edema: Secondary | ICD-10-CM | POA: Diagnosis not present

## 2017-10-05 DIAGNOSIS — E782 Mixed hyperlipidemia: Secondary | ICD-10-CM | POA: Diagnosis not present

## 2017-10-05 DIAGNOSIS — I110 Hypertensive heart disease with heart failure: Secondary | ICD-10-CM | POA: Diagnosis not present

## 2017-11-27 ENCOUNTER — Encounter: Payer: Self-pay | Admitting: Pulmonary Disease

## 2017-11-27 ENCOUNTER — Ambulatory Visit (INDEPENDENT_AMBULATORY_CARE_PROVIDER_SITE_OTHER): Payer: 59 | Admitting: Pulmonary Disease

## 2017-11-27 VITALS — BP 130/82 | HR 87 | Ht 63.5 in | Wt 195.0 lb

## 2017-11-27 DIAGNOSIS — R059 Cough, unspecified: Secondary | ICD-10-CM

## 2017-11-27 DIAGNOSIS — R05 Cough: Secondary | ICD-10-CM

## 2017-11-27 NOTE — Addendum Note (Signed)
Addended by: Maxwell MarionBLANKENSHIP, MARGIE A on: 11/27/2017 12:04 PM   Modules accepted: Orders

## 2017-11-27 NOTE — Progress Notes (Signed)
Megan Nicholson    [id number]    11/18/1955  Primary Care Physician:Shaw, Rockney Ghee, MD  Referring Physician: Lupita Raider, MD [address] [place], [place] [address]   Chief complaint: Follow-up for cough  HPI: 62 year old with past medical history of hypertension, hyperlipidemia, diabetes. She has complains of cough for the past 2-3 years. This is nonproductive in nature and is most evident while eating. Denies any dyspnea, wheezing.she has symptoms of allergic rhinitis with postnasal drip, GERD. She had a recent evaluation with barium swallow which showed stricture.  She had an EGD by [facility] this month which showed benign esophageal stricture with gastritis.  She underwent dilation and is on omeprazole for acid suppression.  Pets: none Occupation:Shift manager at [employer]  Exposures:no known exposures  Smoking history:None Travel History:No recent travel   Interim history: Taken on Symbicort at last visit.  States that the breathing is doing well with no change She still uses albuterol occasionally Reports slightly worsening cough during allergy season.  Outpatient Encounter Medications as of 11/27/2017  Medication Sig  . albuterol (PROVENTIL HFA;VENTOLIN HFA) 108 (90 Base) MCG/ACT inhaler Inhale 2 puffs into the lungs every 6 (six) hours as needed for wheezing or shortness of breath.  Marland Kitchen amLODipine (NORVASC) 10 MG tablet Take 10 mg by mouth daily.  Marland Kitchen atorvastatin (LIPITOR) 80 MG tablet Take 80 mg by mouth daily.  Marland Kitchen azelastine (ASTELIN) 0.1 % nasal spray Place 2 sprays into both nostrils 2 (two) times daily. Use in each nostril as directed  . chlorpheniramine (CHLOR-TRIMETON) 4 MG tablet Take 2 tablets (8 mg total) by mouth 3 (three) times daily.  Marland Kitchen ibuprofen (ADVIL,MOTRIN) 600 MG tablet Take 1 tablet (600 mg total) by mouth every 8 (eight) hours as needed.  . metFORMIN (GLUCOPHAGE) 1000 MG tablet Take 1,000 mg by mouth 2 (two) times daily with a  meal.   No facility-administered encounter medications on file as of 11/27/2017.     Allergies as of 11/27/2017 - Review Complete 11/27/2017  Allergen Reaction Noted  . Meloxicam Hives 07/13/2017    Past Medical History:  Diagnosis Date  . Asthma   . Whooping cough     No past surgical history on file.  Family History  Problem Relation Age of Onset  . Hypertension Mother   . Hypertension Father   . Hypertension Sister     Social History   Socioeconomic History  . Marital status: Married    Spouse name: Not on file  . Number of children: Not on file  . Years of education: Not on file  . Highest education level: Not on file  Occupational History  . Not on file  Social Needs  . Financial resource strain: Not on file  . Food insecurity:    Worry: Not on file    Inability: Not on file  . Transportation needs:    Medical: Not on file    Non-medical: Not on file  Tobacco Use  . Smoking status: Never Smoker  . Smokeless tobacco: Never Used  Substance and Sexual Activity  . Alcohol use: No  . Drug use: No  . Sexual activity: Not on file  Lifestyle  . Physical activity:    Days per week: Not on file    Minutes per session: Not on file  . Stress: Not on file  Relationships  . Social connections:    Talks on phone: Not on file    Gets  together: Not on file    Attends religious service: Not on file    Active member of club or organization: Not on file    Attends meetings of clubs or organizations: Not on file    Relationship status: Not on file  . Intimate partner violence:    Fear of current or ex partner: Not on file    Emotionally abused: Not on file    Physically abused: Not on file    Forced sexual activity: Not on file  Other Topics Concern  . Not on file  Social History Narrative  . Not on file   Review of systems: Review of Systems  Constitutional: Negative for fever and chills.  HENT: Negative.   Eyes: Negative for blurred vision.  Respiratory:  as per HPI  Cardiovascular: Negative for chest pain and palpitations.  Gastrointestinal: Negative for vomiting, diarrhea, blood per rectum. Genitourinary: Negative for dysuria, urgency, frequency and hematuria.  Musculoskeletal: Negative for myalgias, back pain and joint pain.  Skin: Negative for itching and rash.  Neurological: Negative for dizziness, tremors, focal weakness, seizures and loss of consciousness.  Endo/Heme/Allergies: Negative for environmental allergies.  Psychiatric/Behavioral: Negative for depression, suicidal ideas and hallucinations.  All other systems reviewed and are negative.  Physical Exam: Blood pressure 130/82, pulse 87, height 5' 3.5" (1.613 m), weight 195 lb (88.5 kg), SpO2 96 %. Gen:      No acute distress HEENT:  EOMI, sclera anicteric Neck:     No masses; no thyromegaly Lungs:    Clear to auscultation bilaterally; normal respiratory effort CV:         Regular rate and rhythm; no murmurs Abd:      + bowel sounds; soft, non-tender; no palpable masses, no distension Ext:    No edema; adequate peripheral perfusion Skin:      Warm and dry; no rash Neuro: alert and oriented x 3 Psych: normal mood and affect  Data Reviewed: Chest x-ray 04/07/17-no acute cardiopulmonary abnormality.   Chest x-ray 05/11/17-no acute cardiopulmonary abnormality Reviewed images personally  Barium swallow 04/14/17- Stasis of 13 mm barium tablet in the distal esophagus, compatible with stricture. No abrupt shouldering or mucosal irregularity seen on single or double contrast imaging to raise concern for neoplasm. Upper endoscopy may prove helpful to further evaluate.  Tiny Sliding type hiatal hernia.  FENO 05/11/17- 8  EGD 05/19/17 Normal larynx, hiatal hernia Benign esophageal stenosis-dilated, chronic gastritis  PFTs 06/08/17 FVC 1.7 [6 6%], FEV1 1.51 [94%], F/F 89, TLC 76%, DLCO 67%, DLCO/VA 105% Minimal restriction, reduced diffusion capacity which corrects for alveolar  volume  Assessment:  Evaluation for chronic cough Suspect upper airway cough from GERD and esophageal stricture. She may have a component of postnasal drip, rhinitis. Suspicion for asthma as low as symptoms are not very typical and FENO is normal. PFTs reviewed.  There is no evidence of obstruction or bronchodilator response.  There was minimal restriction and diffusion impairment that corrects for alveolar volume.  I suspect this is secondary to her body habitus.  There is no evidence of interstitial lung disease on chest x-ray  For postnasal drip continue chlorpheniramine antihistamine 8 mg 3 times daily and Astelin nasal spray For GERD and esophageal stricture she will continue protonix and has follow up with GI. I educated her on behavioral changes to deal with cough including conscious suppression of the urge to cough, use of throat lozenges.  Plan/Recommendations: - Continue albuterol as needed. - Chlorphentermine, Astelin nasal spray - Continue Protonix,  follow up with GI  Chilton GreathousePraveen Wiley Magan MD [facility] Pager [phone] 11/27/2017, 11:54 AM

## 2017-11-27 NOTE — Patient Instructions (Signed)
Continue using antiallergy medication, no spray and antiacid medication Follow-up in 6 months. Please call sooner if there is any worsening of his symptoms.

## 2017-12-06 DIAGNOSIS — Z1231 Encounter for screening mammogram for malignant neoplasm of breast: Secondary | ICD-10-CM | POA: Diagnosis not present

## 2018-04-13 DIAGNOSIS — Z23 Encounter for immunization: Secondary | ICD-10-CM | POA: Diagnosis not present

## 2018-04-13 DIAGNOSIS — E782 Mixed hyperlipidemia: Secondary | ICD-10-CM | POA: Diagnosis not present

## 2018-04-13 DIAGNOSIS — E11319 Type 2 diabetes mellitus with unspecified diabetic retinopathy without macular edema: Secondary | ICD-10-CM | POA: Diagnosis not present

## 2018-04-13 DIAGNOSIS — Z0000 Encounter for general adult medical examination without abnormal findings: Secondary | ICD-10-CM | POA: Diagnosis not present

## 2018-04-13 DIAGNOSIS — I110 Hypertensive heart disease with heart failure: Secondary | ICD-10-CM | POA: Diagnosis not present

## 2018-04-19 DIAGNOSIS — M47812 Spondylosis without myelopathy or radiculopathy, cervical region: Secondary | ICD-10-CM | POA: Diagnosis not present

## 2018-04-19 DIAGNOSIS — E559 Vitamin D deficiency, unspecified: Secondary | ICD-10-CM | POA: Diagnosis not present

## 2018-04-19 DIAGNOSIS — M889 Osteitis deformans of unspecified bone: Secondary | ICD-10-CM | POA: Diagnosis not present

## 2018-04-19 DIAGNOSIS — M25559 Pain in unspecified hip: Secondary | ICD-10-CM | POA: Diagnosis not present

## 2018-04-19 DIAGNOSIS — M4186 Other forms of scoliosis, lumbar region: Secondary | ICD-10-CM | POA: Diagnosis not present

## 2018-04-19 DIAGNOSIS — M549 Dorsalgia, unspecified: Secondary | ICD-10-CM | POA: Diagnosis not present

## 2018-04-19 DIAGNOSIS — M88851 Osteitis deformans of right thigh: Secondary | ICD-10-CM | POA: Diagnosis not present

## 2018-06-06 ENCOUNTER — Other Ambulatory Visit: Payer: Self-pay | Admitting: Podiatry

## 2018-06-11 DIAGNOSIS — M25571 Pain in right ankle and joints of right foot: Secondary | ICD-10-CM | POA: Diagnosis not present

## 2018-06-11 DIAGNOSIS — M889 Osteitis deformans of unspecified bone: Secondary | ICD-10-CM | POA: Diagnosis not present

## 2018-06-11 DIAGNOSIS — M549 Dorsalgia, unspecified: Secondary | ICD-10-CM | POA: Diagnosis not present

## 2018-06-11 DIAGNOSIS — M25559 Pain in unspecified hip: Secondary | ICD-10-CM | POA: Diagnosis not present

## 2018-06-11 DIAGNOSIS — M8588 Other specified disorders of bone density and structure, other site: Secondary | ICD-10-CM | POA: Diagnosis not present

## 2018-06-11 DIAGNOSIS — M19072 Primary osteoarthritis, left ankle and foot: Secondary | ICD-10-CM | POA: Diagnosis not present

## 2018-06-11 DIAGNOSIS — M79671 Pain in right foot: Secondary | ICD-10-CM | POA: Diagnosis not present

## 2018-06-25 DIAGNOSIS — M1611 Unilateral primary osteoarthritis, right hip: Secondary | ICD-10-CM | POA: Diagnosis not present

## 2018-06-25 DIAGNOSIS — M25551 Pain in right hip: Secondary | ICD-10-CM | POA: Diagnosis not present

## 2018-07-04 DIAGNOSIS — M25551 Pain in right hip: Secondary | ICD-10-CM | POA: Diagnosis not present

## 2018-07-13 DIAGNOSIS — M88859 Osteitis deformans of unspecified thigh: Secondary | ICD-10-CM | POA: Diagnosis not present

## 2018-07-13 DIAGNOSIS — M1611 Unilateral primary osteoarthritis, right hip: Secondary | ICD-10-CM | POA: Diagnosis not present

## 2018-07-17 DIAGNOSIS — E119 Type 2 diabetes mellitus without complications: Secondary | ICD-10-CM | POA: Diagnosis not present

## 2018-07-17 DIAGNOSIS — H2513 Age-related nuclear cataract, bilateral: Secondary | ICD-10-CM | POA: Diagnosis not present

## 2018-07-20 IMAGING — RF DG ESOPHAGUS
11 series · 14 of 24 positions shown · non-contrast
Comparison: None.

CLINICAL DATA: Dysphagia

EXAM:
ESOPHOGRAM / BARIUM SWALLOW / BARIUM TABLET STUDY
TECHNIQUE: Combined double contrast and single contrast examination performed
using effervescent crystals, thick barium liquid, and thin barium
liquid. The patient was observed with fluoroscopy swallowing a 13 mm
barium sulphate tablet.
FLUOROSCOPY TIME:  Fluoroscopy Time:  3 minutes and 24 seconds.
Radiation Exposure Index (if provided by the fluoroscopic device):
275 mGy
Number of Acquired Spot Images: 0

[Series 1: sequence · 2 of 13 frames shown (1 of 10)]
[frame 2/13]
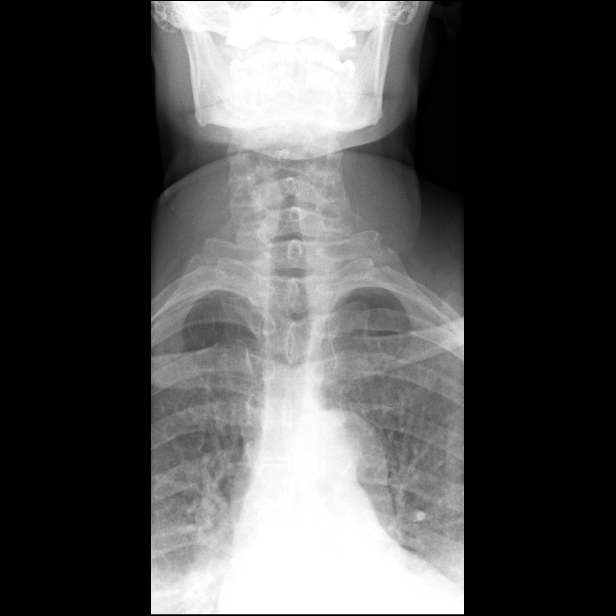
[frame 12/13]
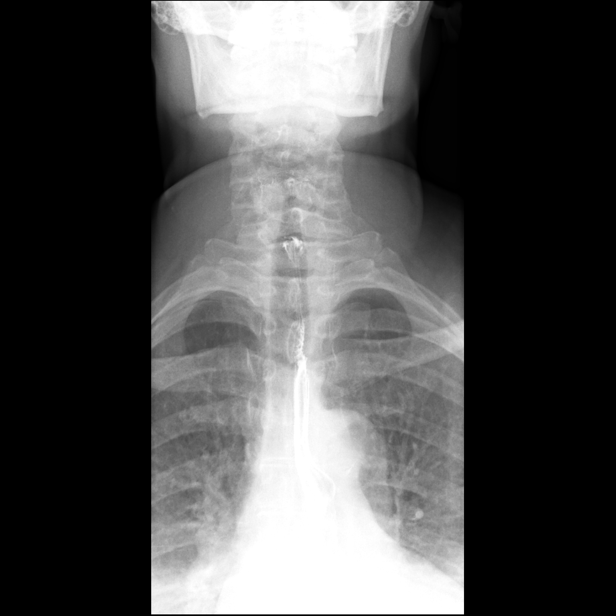

[Series 2: sequence · 1 of 11 frames shown (2 of 10)]
[frame 11/11]
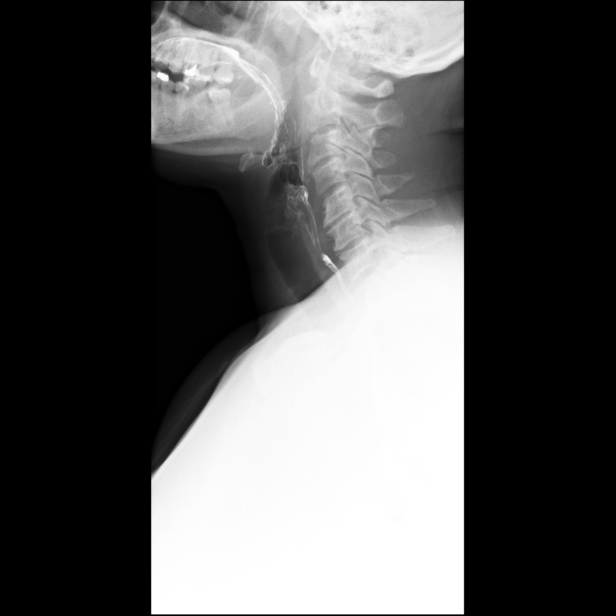

[Series 3: sequence · 1 of 28 frames shown (3 of 10)]
[frame 21/28]
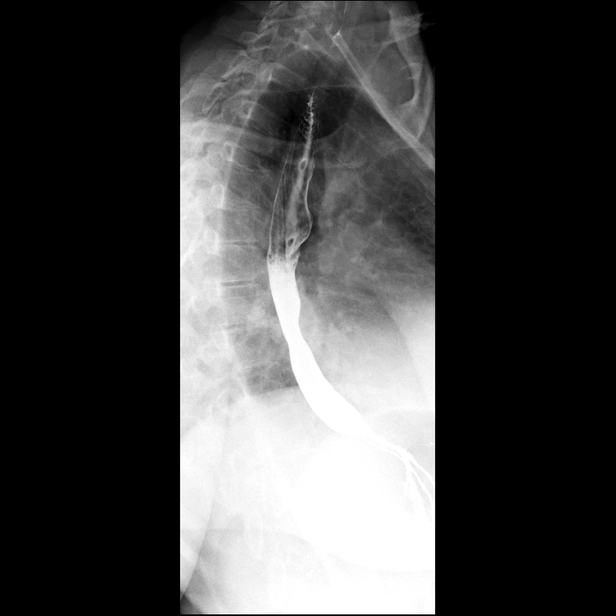

[Series 4: sequence · 1 of 101 frames shown (4 of 10)]
[frame 16/101]
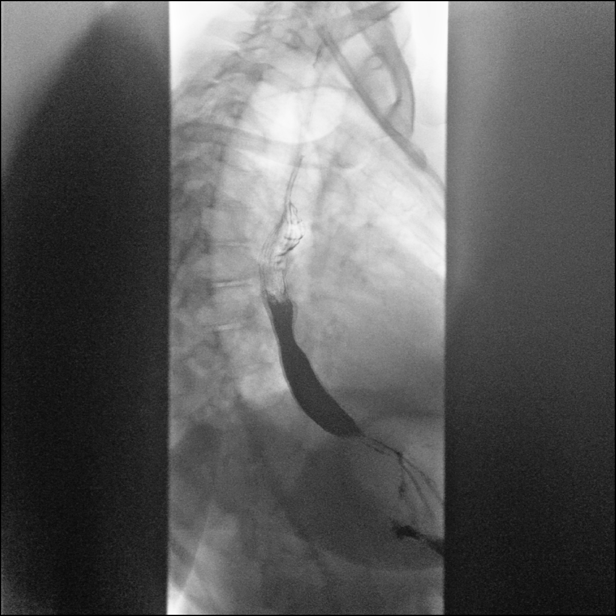

[Series 5: sequence · 2 of 40 frames shown (5 of 10)]
[frame 7/40]
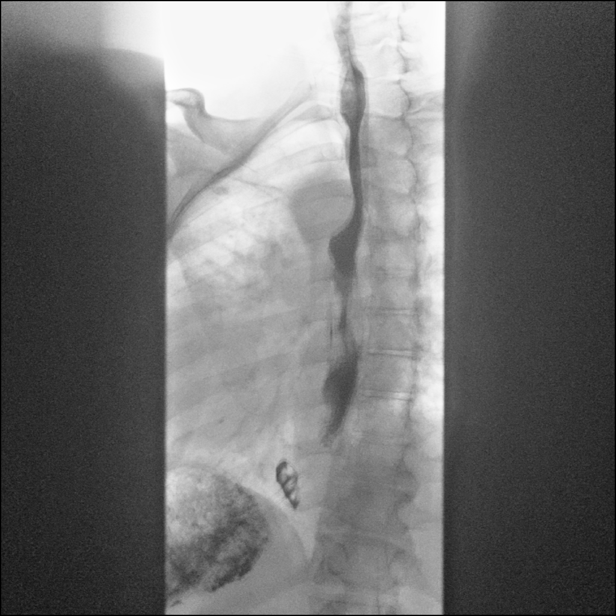
[frame 35/40]
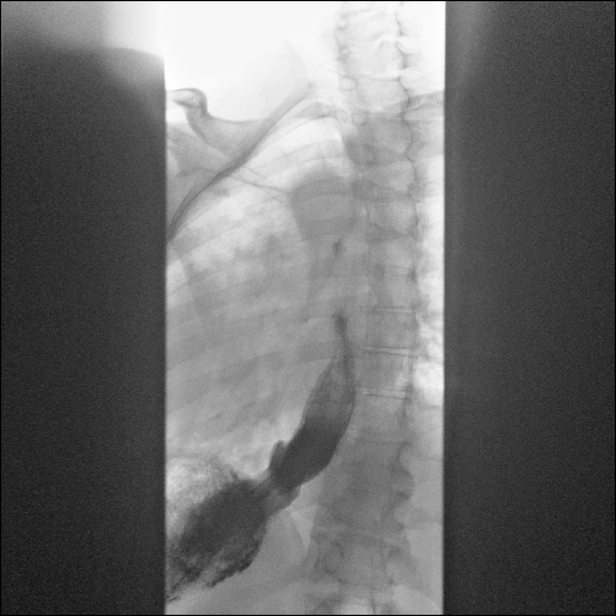

[Series 6: sequence · 1 of 35 frames shown (6 of 10)]
[frame 6/35]
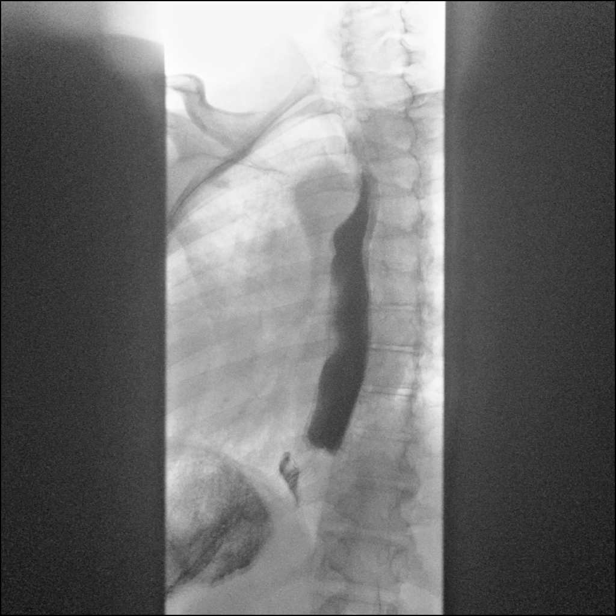

[Series 7: sequence · 1 of 37 frames shown (7 of 10)]
[frame 6/37]
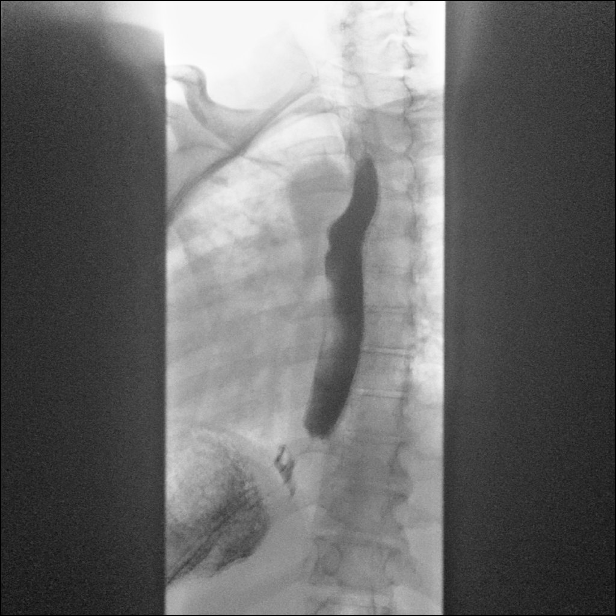

[Series 8: one shot · 1 of 1 slices shown]
[im 1/1]
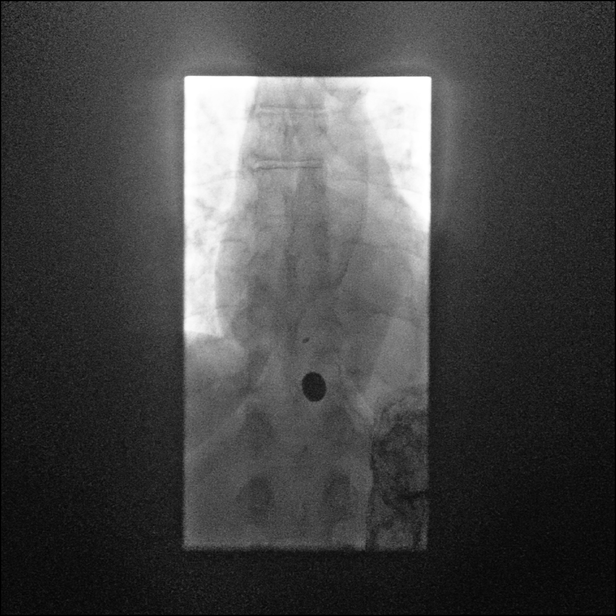

[Series 9: sequence · 1 of 63 frames shown (8 of 10)]
[frame 32/63]
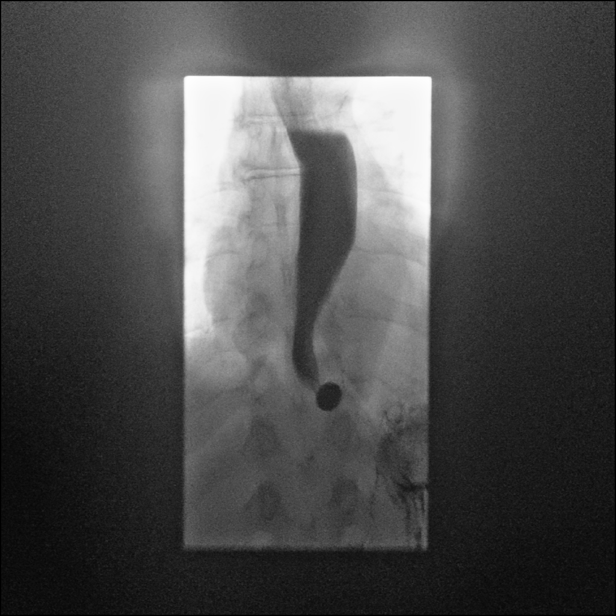

[Series 10: sequence · 1 of 31 frames shown (9 of 10)]
[frame 5/31]
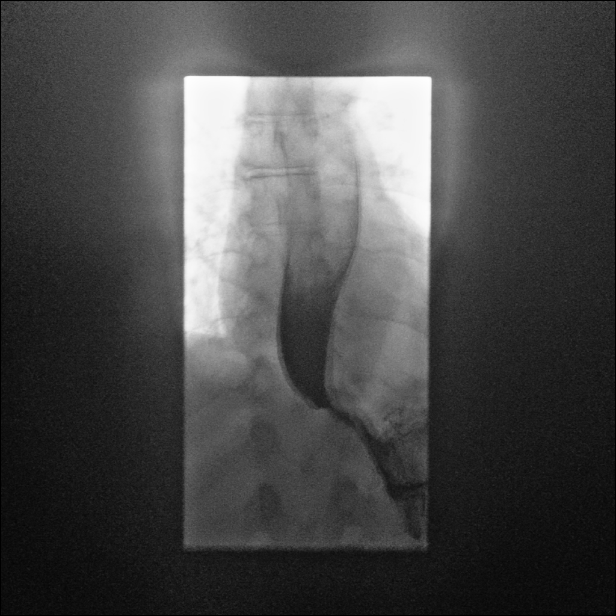

[Series 11: sequence · 2 of 61 frames shown (10 of 10)]
[frame 5/61]
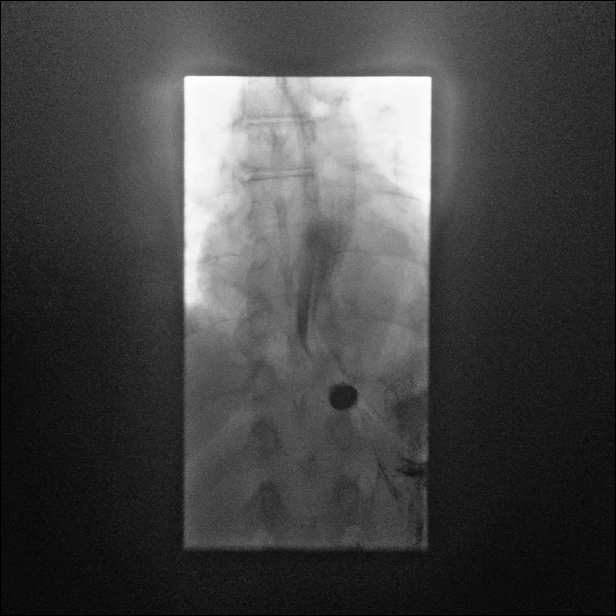
[frame 52/61]
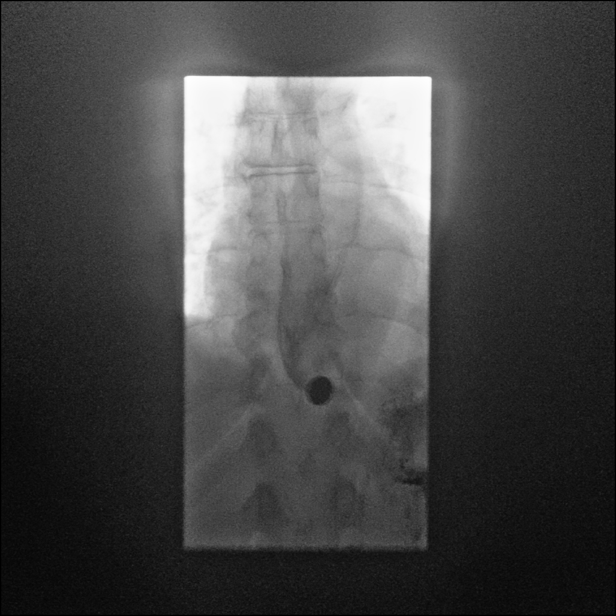

[14 of 24 positions shown; findings below may reference images not displayed]

FINDINGS: Frontal and lateral views of the hypopharynx while swallowing are
normal. Double contrast imaging shows no evidence for esophageal
diverticulum, mass lesion, or gross mucosal ulceration. Tiny sliding
type hiatal hernia is evident.

Esophageal motility is normal with good preservation of primary
peristalsis. 13 mm barium tablet becomes lodged in the distal
esophagus, just proximal to the hiatal hernia. Despite multiple
repeat swallows of thin barium and water, the tablet does not pass
into the stomach.
IMPRESSION: Stasis of 13 mm barium tablet in the distal esophagus, compatible
with stricture. No abrupt shouldering or mucosal irregularity seen
on single or double contrast imaging to raise concern for neoplasm.
Upper endoscopy may prove helpful to further evaluate.

Tiny Sliding type hiatal hernia.

## 2018-07-26 ENCOUNTER — Other Ambulatory Visit: Payer: Self-pay | Admitting: Orthopedic Surgery

## 2018-07-26 DIAGNOSIS — M1611 Unilateral primary osteoarthritis, right hip: Secondary | ICD-10-CM

## 2018-08-13 ENCOUNTER — Ambulatory Visit
Admission: RE | Admit: 2018-08-13 | Discharge: 2018-08-13 | Disposition: A | Discharge status: Home / Self Care | Payer: 59 | Source: Ambulatory Visit | Attending: Orthopedic Surgery | Admitting: Orthopedic Surgery

## 2018-08-13 DIAGNOSIS — M1611 Unilateral primary osteoarthritis, right hip: Secondary | ICD-10-CM | POA: Diagnosis not present

## 2018-08-13 MED ORDER — IOPAMIDOL (ISOVUE-M 200) INJECTION 41%
1.0000 mL | Freq: Once | INTRAMUSCULAR | Status: AC
  Administered 2018-08-13: 1 mL via INTRA_ARTICULAR

## 2018-08-13 MED ORDER — METHYLPREDNISOLONE ACETATE 40 MG/ML INJ SUSP (RADIOLOG
120.0000 mg | Freq: Once | INTRAMUSCULAR | Status: AC
  Administered 2018-08-13: 120 mg via INTRA_ARTICULAR

## 2018-10-18 DIAGNOSIS — I110 Hypertensive heart disease with heart failure: Secondary | ICD-10-CM | POA: Diagnosis not present

## 2018-10-18 DIAGNOSIS — E782 Mixed hyperlipidemia: Secondary | ICD-10-CM | POA: Diagnosis not present

## 2018-10-18 DIAGNOSIS — E11319 Type 2 diabetes mellitus with unspecified diabetic retinopathy without macular edema: Secondary | ICD-10-CM | POA: Diagnosis not present

## 2018-10-24 ENCOUNTER — Other Ambulatory Visit: Payer: Self-pay | Admitting: Podiatry

## 2018-10-31 ENCOUNTER — Telehealth: Payer: Self-pay | Admitting: Pulmonary Disease

## 2018-10-31 MED ORDER — AZELASTINE HCL 0.1 % NA SOLN: 2.0000 | Freq: Two times a day (BID) | NASAL | 3 refills | Status: DC

## 2018-10-31 MED ORDER — ALBUTEROL SULFATE HFA 108 (90 BASE) MCG/ACT IN AERS: 2.0000 | INHALATION_SPRAY | Freq: Four times a day (QID) | RESPIRATORY_TRACT | 3 refills | Status: DC | PRN

## 2018-10-31 NOTE — Telephone Encounter (Signed)
Called and spoke with Patient.  Patient OV was cancelled 11/01/18, per office protocol.  Recall placed for June/July appointment.  Patient requested refills for Albuterol inhaler and Astelin nasal spray.  Prescriptions sent to requested [facility].  New office location given.  Understanding stated.  Nothing further at this time.

## 2018-11-01 ENCOUNTER — Ambulatory Visit: Payer: 59 | Admitting: Pulmonary Disease

## 2019-03-19 ENCOUNTER — Ambulatory Visit (INDEPENDENT_AMBULATORY_CARE_PROVIDER_SITE_OTHER): Payer: 59 | Admitting: Pulmonary Disease

## 2019-03-19 ENCOUNTER — Other Ambulatory Visit: Payer: Self-pay

## 2019-03-19 ENCOUNTER — Encounter: Payer: Self-pay | Admitting: Pulmonary Disease

## 2019-03-19 VITALS — BP 120/68 | HR 70 | Temp 98.2°F | Ht 63.0 in | Wt 197.2 lb

## 2019-03-19 DIAGNOSIS — R05 Cough: Secondary | ICD-10-CM

## 2019-03-19 DIAGNOSIS — R059 Cough, unspecified: Secondary | ICD-10-CM

## 2019-03-19 MED ORDER — FLUTICASONE PROPIONATE 50 MCG/ACT NA SUSP: 2.0000 | Freq: Every day | NASAL | 2 refills | Status: DC

## 2019-03-19 NOTE — Progress Notes (Signed)
Yong ChannelGennies M Laird    [id number]    02/23/1956  Primary Care Physician:Shaw, Rockney GheeKimberlee, MD  Referring Physician: Lupita RaiderShaw, Kimberlee, MD [address] [place],  [place] [id number]   Chief complaint: Follow-up for cough  HPI: 63 year old with past medical history of hypertension, hyperlipidemia, diabetes. She has complains of cough for the past 2-3 years. This is nonproductive in nature and is most evident while eating. Denies any dyspnea, wheezing.she has symptoms of allergic rhinitis with postnasal drip, GERD. She had a recent evaluation with barium swallow which showed stricture.  She had an EGD by Dr. Maryjane HurterMagod, [facility] in April 2019which showed benign esophageal stricture with gastritis.  She underwent dilation and is on omeprazole for acid suppression.  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 She is on antiacid medication per GI  Outpatient Encounter Medications as of 03/19/2019  Medication Sig  . albuterol (PROVENTIL HFA;VENTOLIN HFA) 108 (90 Base) MCG/ACT inhaler Inhale 2 puffs into the lungs every 6 (six) hours as needed for wheezing or shortness of breath.  Marland Kitchen. amLODipine (NORVASC) 10 MG tablet Take 10 mg by mouth daily.  Marland Kitchen. atorvastatin (LIPITOR) 80 MG tablet Take 80 mg by mouth daily.  Marland Kitchen. azelastine (ASTELIN) 0.1 % nasal spray Place 2 sprays into both nostrils 2 (two) times daily. Use in each nostril as directed  . chlorpheniramine (CHLOR-TRIMETON) 4 MG tablet Take 2 tablets (8 mg total) by mouth 3 (three) times daily.  Marland Kitchen. glipiZIDE-metformin (METAGLIP) 5-500 MG tablet Take 1 tablet by mouth 2 (two) times daily before a meal.  . IBU 600 MG tablet TAKE ONE TABLET BY MOUTH EVERY EIGHT HOURS AS NEEDED  . [DISCONTINUED] metFORMIN (GLUCOPHAGE) 1000  MG tablet Take 1,000 mg by mouth 2 (two) times daily with a meal.   No facility-administered encounter medications on file as of 03/19/2019.    Physical Exam: Blood pressure 120/68, pulse 70, temperature 98.2 F (36.8 C), temperature source Oral, height 5\' 3"  (1.6 m), weight 197 lb 3.2 oz (89.4 kg), SpO2 97 %. Gen:      No acute distress HEENT:  EOMI, sclera anicteric Neck:     No masses; no thyromegaly Lungs:    Clear to auscultation bilaterally; normal respiratory effort CV:         Regular rate and rhythm; no murmurs Abd:      + bowel sounds; soft, non-tender; no palpable masses, no distension Ext:    No edema; adequate peripheral perfusion Skin:      Warm and dry; no rash Neuro: alert and oriented x 3 Psych: normal mood and affect  Data Reviewed: Imaging: Chest x-ray 04/07/17-no acute cardiopulmonary abnormality.   Chest x-ray 05/11/17-no acute cardiopulmonary abnormality Reviewed images personally  Barium swallow 04/14/17- Stasis of 13 mm barium tablet in the distal esophagus, compatible with stricture. No abrupt shouldering or mucosal irregularity seen on single or double contrast imaging to raise concern for neoplasm. Upper endoscopy may prove helpful to further evaluate.  Tiny Sliding type hiatal hernia.  PFTs: 06/08/17 FVC 1.7 [6 6%], FEV1 1.51 [94%], F/F 89, TLC 76%, DLCO 67%, DLCO/VA 105% Minimal restriction, reduced diffusion capacity which corrects for alveolar volume  FENO 05/11/17- 8  GI EGD 05/19/17 Normal larynx, hiatal hernia Benign esophageal stenosis-dilated, chronic gastritis  Assessment:  Chronic cough Suspect upper airway cough from GERD and esophageal stricture. She may have a component of postnasal drip, rhinitis. Suspicion for asthma as low as symptoms are not very typical and FENO is normal. PFTs reviewed.  There is no evidence of obstruction or bronchodilator response.  There was minimal restriction and diffusion impairment that corrects for  alveolar volume.  I suspect this is secondary to her body habitus.  There is no evidence of interstitial lung disease on chest x-ray  For postnasal drip start flonase, chlorpheniramine antihistamine 8 mg 3 times daily and continue Astelin nasal spray For GERD and esophageal stricture she will continue protonix and has follow up with GI. I educated her on behavioral changes to deal with cough including conscious suppression of the urge to cough, use of throat lozenges.  Plan/Recommendations: - Continue albuterol as needed. - Chlorphentermine, Astelin nasal spray - Continue Protonix, follow up with GI  Marshell Garfinkel MD [facility] Pager [phone] 03/19/2019, 2:09 PM

## 2019-03-19 NOTE — Patient Instructions (Signed)
Continue antiacid medication and Astelin Start Flonase nasal spray Start chlorpheniramine 8 mg 3 times daily  Follow-up in 1 month.

## 2019-04-10 ENCOUNTER — Other Ambulatory Visit: Payer: Self-pay | Admitting: Pulmonary Disease

## 2019-04-11 ENCOUNTER — Other Ambulatory Visit: Payer: Self-pay

## 2019-04-11 ENCOUNTER — Encounter: Payer: Self-pay | Admitting: Pulmonary Disease

## 2019-04-11 ENCOUNTER — Ambulatory Visit (INDEPENDENT_AMBULATORY_CARE_PROVIDER_SITE_OTHER): Payer: 59 | Admitting: Pulmonary Disease

## 2019-04-11 VITALS — BP 134/78 | HR 78 | Temp 97.7°F | Ht 63.0 in | Wt 198.6 lb

## 2019-04-11 DIAGNOSIS — R05 Cough: Secondary | ICD-10-CM

## 2019-04-11 DIAGNOSIS — R059 Cough, unspecified: Secondary | ICD-10-CM

## 2019-04-11 DIAGNOSIS — Z23 Encounter for immunization: Secondary | ICD-10-CM | POA: Diagnosis not present

## 2019-04-11 NOTE — Progress Notes (Signed)
Megan Nicholson    [id number]    08/18/1955  Primary Care Physician:Shaw, Rockney GheeKimberlee, MD  Referring Physician: Lupita RaiderShaw, Kimberlee, MD [address] [place],  [place] [address]   Chief complaint: Follow-up for cough  HPI: 63 year old with past medical history of hypertension, hyperlipidemia, diabetes. She has complains of cough for the past 2-3 years. This is nonproductive in nature and is most evident while eating. Denies any dyspnea, wheezing.she has symptoms of allergic rhinitis with postnasal drip, GERD. She had a recent evaluation with barium swallow which showed stricture.  She had an EGD by Dr. Maryjane HurterMagod, [facility] in April 2019which showed benign esophageal stricture with gastritis.  She underwent dilation and is on omeprazole for acid suppression.  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  States that her cough is improved.  She has very rare cough now with clear mucus.  Denies any dyspnea, wheezing.  Outpatient Encounter Medications as of 04/11/2019  Medication Sig  . albuterol (PROVENTIL HFA;VENTOLIN HFA) 108 (90 Base) MCG/ACT inhaler Inhale 2 puffs into the lungs every 6 (six) hours as needed for wheezing or shortness of breath.  Marland Kitchen. amLODipine (NORVASC) 10 MG tablet Take 10 mg by mouth daily.  Marland Kitchen. atorvastatin (LIPITOR) 80 MG tablet Take 80 mg by mouth daily.  Marland Kitchen. azelastine (ASTELIN) 0.1 % nasal spray Place 2 sprays into both nostrils 2 (two) times daily. Use in each nostril as directed  . chlorpheniramine (CHLOR-TRIMETON) 4 MG tablet Take 2 tablets (8 mg total) by mouth 3 (three) times daily.  . fluticasone (FLONASE) 50 MCG/ACT nasal spray SPRAY 2 SPRAYS INTO EACH NOSTRIL EVERY DAY  . glipiZIDE-metformin (METAGLIP) 5-500 MG tablet Take 1 tablet by mouth 2 (two) times daily before a meal.   . IBU 600 MG tablet TAKE ONE TABLET BY MOUTH EVERY EIGHT HOURS AS NEEDED  . naproxen (NAPROSYN) 500 MG tablet Take 500 mg by mouth 2 (two) times daily with a meal.   No facility-administered encounter medications on file as of 04/11/2019.    Physical Exam: Blood pressure 134/78, pulse 78, temperature 97.7 F (36.5 C), height 5\' 3"  (1.6 m), weight 198 lb 9.6 oz (90.1 kg), SpO2 97 %. Gen:      No acute distress HEENT:  EOMI, sclera anicteric Neck:     No masses; no thyromegaly Lungs:    Clear to auscultation bilaterally; normal respiratory effort CV:         Regular rate and rhythm; no murmurs Abd:      + bowel sounds; soft, non-tender; no palpable masses, no distension Ext:    No edema; adequate peripheral perfusion Skin:      Warm and dry; no rash Neuro: alert and oriented x 3 Psych: normal mood and affect  Data Reviewed: Imaging: Chest x-ray 04/07/17-no acute cardiopulmonary abnormality.   Chest x-ray 05/11/17-no acute cardiopulmonary abnormality Reviewed images personally  Barium swallow 04/14/17- Stasis of 13 mm barium tablet in the distal esophagus, compatible with stricture. No abrupt shouldering or mucosal irregularity seen on single or double contrast imaging to raise concern for neoplasm. Upper endoscopy may prove helpful to further evaluate.  Tiny Sliding type hiatal hernia.  PFTs: 06/08/17 FVC 1.7 [6 6%], FEV1 1.51 [94%], F/F 89, TLC 76%, DLCO 67%, DLCO/VA 105% Minimal restriction, reduced diffusion capacity which corrects for alveolar  volume  FENO 05/11/17- 8  GI EGD 05/19/17 Normal larynx, hiatal hernia Benign esophageal stenosis-dilated, chronic gastritis  Assessment:  Chronic cough Has upper airway cough from GERD and esophageal stricture. She may have a component of postnasal drip, rhinitis. Suspicion for asthma as low as symptoms are not very typical and FENO is normal. PFTs reviewed.  There is no evidence of obstruction or bronchodilator response.  There was  minimal restriction and diffusion impairment that corrects for alveolar volume.  I suspect this is secondary to her body habitus.  There is no evidence of interstitial lung disease on chest x-ray  Continue Flonase, chlorpheniramine, Astelin  GERD, esophageal stricture Continue protonix.  Follow-up with GI  Health maintenance Flu vaccine today  Plan/Recommendations: - Chlorphentermine, Astelin nasal spray, Flonase - Continue Protonix, follow up with GI - Flu vaccine  Marshell Garfinkel MD [facility] 04/11/2019, 2:27 PM

## 2019-04-11 NOTE — Patient Instructions (Signed)
I am glad you are feeling well and your cough is improved Continue the nose spray and allergy medications as prescribed Continue the antiacid medication follow-up with your gastroenterologist We will give you a flu vaccine today  Follow-up in 6 months

## 2019-04-11 NOTE — Addendum Note (Signed)
Addended by: Elton Sin on: 04/11/2019 02:59 PM   Modules accepted: Orders

## 2019-04-29 ENCOUNTER — Other Ambulatory Visit: Payer: Self-pay | Admitting: Family Medicine

## 2019-04-29 DIAGNOSIS — E041 Nontoxic single thyroid nodule: Secondary | ICD-10-CM

## 2019-04-29 DIAGNOSIS — Z0000 Encounter for general adult medical examination without abnormal findings: Secondary | ICD-10-CM | POA: Diagnosis not present

## 2019-04-30 DIAGNOSIS — E11319 Type 2 diabetes mellitus with unspecified diabetic retinopathy without macular edema: Secondary | ICD-10-CM | POA: Diagnosis not present

## 2019-04-30 DIAGNOSIS — I110 Hypertensive heart disease with heart failure: Secondary | ICD-10-CM | POA: Diagnosis not present

## 2019-04-30 DIAGNOSIS — E041 Nontoxic single thyroid nodule: Secondary | ICD-10-CM | POA: Diagnosis not present

## 2019-04-30 DIAGNOSIS — M889 Osteitis deformans of unspecified bone: Secondary | ICD-10-CM | POA: Diagnosis not present

## 2019-04-30 DIAGNOSIS — E782 Mixed hyperlipidemia: Secondary | ICD-10-CM | POA: Diagnosis not present

## 2019-05-03 ENCOUNTER — Ambulatory Visit
Admission: RE | Admit: 2019-05-03 | Discharge: 2019-05-03 | Disposition: A | Discharge status: Home / Self Care | Payer: BC Managed Care – PPO | Source: Ambulatory Visit | Attending: Family Medicine | Admitting: Family Medicine

## 2019-05-03 DIAGNOSIS — E041 Nontoxic single thyroid nodule: Secondary | ICD-10-CM | POA: Diagnosis not present

## 2019-05-07 DIAGNOSIS — M654 Radial styloid tenosynovitis [de Quervain]: Secondary | ICD-10-CM | POA: Diagnosis not present

## 2019-06-03 DIAGNOSIS — M549 Dorsalgia, unspecified: Secondary | ICD-10-CM | POA: Diagnosis not present

## 2019-06-03 DIAGNOSIS — M25559 Pain in unspecified hip: Secondary | ICD-10-CM | POA: Diagnosis not present

## 2019-06-03 DIAGNOSIS — M889 Osteitis deformans of unspecified bone: Secondary | ICD-10-CM | POA: Diagnosis not present

## 2019-06-03 DIAGNOSIS — E559 Vitamin D deficiency, unspecified: Secondary | ICD-10-CM | POA: Diagnosis not present

## 2019-06-06 DIAGNOSIS — M654 Radial styloid tenosynovitis [de Quervain]: Secondary | ICD-10-CM | POA: Diagnosis not present

## 2019-07-02 DIAGNOSIS — M654 Radial styloid tenosynovitis [de Quervain]: Secondary | ICD-10-CM | POA: Diagnosis not present

## 2019-07-26 ENCOUNTER — Other Ambulatory Visit: Payer: Self-pay | Admitting: Pulmonary Disease

## 2019-10-28 DIAGNOSIS — I110 Hypertensive heart disease with heart failure: Secondary | ICD-10-CM | POA: Diagnosis not present

## 2019-10-28 DIAGNOSIS — E669 Obesity, unspecified: Secondary | ICD-10-CM | POA: Diagnosis not present

## 2019-10-28 DIAGNOSIS — E1169 Type 2 diabetes mellitus with other specified complication: Secondary | ICD-10-CM | POA: Diagnosis not present

## 2019-10-28 DIAGNOSIS — E782 Mixed hyperlipidemia: Secondary | ICD-10-CM | POA: Diagnosis not present

## 2019-11-21 ENCOUNTER — Other Ambulatory Visit: Payer: Self-pay | Admitting: Pulmonary Disease

## 2019-12-02 DIAGNOSIS — E559 Vitamin D deficiency, unspecified: Secondary | ICD-10-CM | POA: Diagnosis not present

## 2019-12-02 DIAGNOSIS — M889 Osteitis deformans of unspecified bone: Secondary | ICD-10-CM | POA: Diagnosis not present

## 2019-12-02 DIAGNOSIS — M8580 Other specified disorders of bone density and structure, unspecified site: Secondary | ICD-10-CM | POA: Diagnosis not present

## 2019-12-02 DIAGNOSIS — M549 Dorsalgia, unspecified: Secondary | ICD-10-CM | POA: Diagnosis not present

## 2019-12-02 DIAGNOSIS — M1990 Unspecified osteoarthritis, unspecified site: Secondary | ICD-10-CM | POA: Diagnosis not present

## 2019-12-03 ENCOUNTER — Other Ambulatory Visit: Payer: Self-pay

## 2019-12-03 ENCOUNTER — Ambulatory Visit: Payer: BC Managed Care – PPO | Admitting: Pulmonary Disease

## 2019-12-03 ENCOUNTER — Encounter: Payer: Self-pay | Admitting: Pulmonary Disease

## 2019-12-03 VITALS — BP 140/88 | Ht 63.0 in | Wt 190.6 lb

## 2019-12-03 DIAGNOSIS — R05 Cough: Secondary | ICD-10-CM | POA: Diagnosis not present

## 2019-12-03 DIAGNOSIS — R059 Cough, unspecified: Secondary | ICD-10-CM

## 2019-12-03 MED ORDER — FLUTICASONE PROPIONATE 50 MCG/ACT NA SUSP: NASAL | 3 refills | Status: DC

## 2019-12-03 NOTE — Addendum Note (Signed)
Addended by: Luna Kitchens D on: 12/03/2019 03:35 PM   Modules accepted: Orders

## 2019-12-03 NOTE — Patient Instructions (Signed)
I am glad you are doing well with regard to your breathing For the cough continue the nasal spray and antihistamine tablets Make sure you follow-up with GI for acid reflux next He can follow back in pulmonary clinic in 1 year.

## 2019-12-03 NOTE — Progress Notes (Signed)
Megan Nicholson    [id number]    November 08, 1955  Primary Care Physician:Shaw, Nathen May, MD  Referring Physician: Mayra Neer, MD [place] [address] [facility] [address] [place],  [place] [address]   Chief complaint: Follow-up for cough  HPI: 64 year old with past medical history of hypertension, hyperlipidemia, diabetes. She has complains of cough for the past 2-3 years. This is nonproductive in nature and is most evident while eating. Denies any dyspnea, wheezing.she has symptoms of allergic rhinitis with postnasal drip, GERD. She had a recent evaluation with barium swallow which showed stricture.  She had an EGD by Dr. Barron [facility] in April 2019which showed benign esophageal stricture with gastritis.  She underwent dilation and is on omeprazole for acid suppression.  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  States that her cough is improved overall but still has some symptoms especially at night when she has increased postnasal drainage.   Outpatient Encounter Medications as of 12/03/2019  Medication Sig  . albuterol (PROVENTIL HFA;VENTOLIN HFA) 108 (90 Base) MCG/ACT inhaler Inhale 2 puffs into the lungs every 6 (six) hours as needed for wheezing or shortness of breath.  Marland Kitchen amLODipine (NORVASC) 10 MG tablet Take 10 mg by mouth daily.  Marland Kitchen atorvastatin (LIPITOR) 80 MG tablet Take 80 mg by mouth daily.  Marland Kitchen azelastine (ASTELIN) 0.1 % nasal spray PLACE 2 SPRAYS INTO BOTH NOSTRILS 2 (TWO) TIMES DAILY. USE IN EACH NOSTRIL AS DIRECTED  . chlorpheniramine (CHLOR-TRIMETON) 4 MG tablet Take 2 tablets (8 mg total) by mouth 3 (three) times daily.  . fluticasone (FLONASE) 50 MCG/ACT nasal spray SPRAY 2 SPRAYS INTO EACH NOSTRIL EVERY DAY  . glipiZIDE-metformin (METAGLIP) 5-500 MG tablet Take 1 tablet by mouth 2 (two) times daily  before a meal.  . [DISCONTINUED] IBU 600 MG tablet TAKE ONE TABLET BY MOUTH EVERY EIGHT HOURS AS NEEDED (Patient not taking: Reported on 12/03/2019)  . [DISCONTINUED] naproxen (NAPROSYN) 500 MG tablet Take 500 mg by mouth 2 (two) times daily with a meal.   No facility-administered encounter medications on file as of 12/03/2019.   Physical Exam: Blood pressure 140/88, height 5\' 3"  (1.6 m), weight 190 lb 9.6 oz (86.5 kg), SpO2 93 %. Gen:      No acute distress HEENT:  EOMI, sclera anicteric Neck:     No masses; no thyromegaly Lungs:    Clear to auscultation bilaterally; normal respiratory effort CV:         Regular rate and rhythm; no murmurs Abd:      + bowel sounds; soft, non-tender; no palpable masses, no distension Ext:    No edema; adequate peripheral perfusion Skin:      Warm and dry; no rash Neuro: alert and oriented x 3 Psych: normal mood and affect  Data Reviewed: Imaging: Chest x-ray 04/07/17-no acute cardiopulmonary abnormality.   Chest x-ray 05/11/17-no acute cardiopulmonary abnormality Reviewed images personally  Barium swallow 04/14/17- Stasis of 13 mm barium tablet in the distal esophagus, compatible with stricture. No abrupt shouldering or mucosal irregularity seen on single or double contrast imaging to raise concern for neoplasm. Upper endoscopy may prove helpful to further evaluate.  Tiny Sliding type hiatal hernia.  PFTs: 06/08/17 FVC 1.7 [6 6%], FEV1 1.51 [94%], F/F 89, TLC 76%, DLCO 67%, DLCO/VA 105% Minimal restriction, reduced diffusion capacity which corrects for alveolar volume  FENO 05/11/17- 8  GI EGD 05/19/17 Normal larynx, hiatal hernia Benign esophageal stenosis-dilated, chronic gastritis  Assessment:  Chronic cough Has upper airway cough from GERD and esophageal stricture. She may have a component of postnasal drip, rhinitis. Suspicion for asthma as low as symptoms are not very typical and FENO is normal. PFTs reviewed.  There is no evidence of  obstruction or bronchodilator response.  There was minimal restriction and diffusion impairment that corrects for alveolar volume.  I suspect this is secondary to her body habitus.  There is no evidence of interstitial lung disease on chest x-ray  Continue Flonase, chlorpheniramine, Astelin  GERD, esophageal stricture Continue protonix.  Follow-up with GI  Plan/Recommendations: - Chlorphentermine, Astelin nasal spray, Flonase - Continue Protonix, follow up with GI  Chilton Greathouse MD [facility] 12/03/2019, 3:08 PM

## 2019-12-16 DIAGNOSIS — J45991 Cough variant asthma: Secondary | ICD-10-CM | POA: Diagnosis not present

## 2019-12-16 DIAGNOSIS — K219 Gastro-esophageal reflux disease without esophagitis: Secondary | ICD-10-CM | POA: Diagnosis not present

## 2019-12-18 DIAGNOSIS — H2513 Age-related nuclear cataract, bilateral: Secondary | ICD-10-CM | POA: Diagnosis not present

## 2019-12-18 DIAGNOSIS — H52203 Unspecified astigmatism, bilateral: Secondary | ICD-10-CM | POA: Diagnosis not present

## 2019-12-18 DIAGNOSIS — E119 Type 2 diabetes mellitus without complications: Secondary | ICD-10-CM | POA: Diagnosis not present

## 2019-12-18 DIAGNOSIS — H524 Presbyopia: Secondary | ICD-10-CM | POA: Diagnosis not present

## 2020-01-10 DIAGNOSIS — Z1231 Encounter for screening mammogram for malignant neoplasm of breast: Secondary | ICD-10-CM | POA: Diagnosis not present

## 2020-01-13 DIAGNOSIS — J45991 Cough variant asthma: Secondary | ICD-10-CM | POA: Diagnosis not present

## 2020-01-13 DIAGNOSIS — K219 Gastro-esophageal reflux disease without esophagitis: Secondary | ICD-10-CM | POA: Diagnosis not present

## 2020-01-29 DIAGNOSIS — E7800 Pure hypercholesterolemia, unspecified: Secondary | ICD-10-CM | POA: Diagnosis not present

## 2020-03-06 DIAGNOSIS — J45991 Cough variant asthma: Secondary | ICD-10-CM | POA: Diagnosis not present

## 2020-03-06 DIAGNOSIS — K219 Gastro-esophageal reflux disease without esophagitis: Secondary | ICD-10-CM | POA: Diagnosis not present

## 2020-04-17 DIAGNOSIS — H6121 Impacted cerumen, right ear: Secondary | ICD-10-CM | POA: Diagnosis not present

## 2020-04-17 DIAGNOSIS — J301 Allergic rhinitis due to pollen: Secondary | ICD-10-CM | POA: Diagnosis not present

## 2020-05-08 DIAGNOSIS — E041 Nontoxic single thyroid nodule: Secondary | ICD-10-CM | POA: Diagnosis not present

## 2020-05-08 DIAGNOSIS — Z0000 Encounter for general adult medical examination without abnormal findings: Secondary | ICD-10-CM | POA: Diagnosis not present

## 2020-05-08 DIAGNOSIS — E782 Mixed hyperlipidemia: Secondary | ICD-10-CM | POA: Diagnosis not present

## 2020-05-08 DIAGNOSIS — E11319 Type 2 diabetes mellitus with unspecified diabetic retinopathy without macular edema: Secondary | ICD-10-CM | POA: Diagnosis not present

## 2020-05-08 DIAGNOSIS — Z124 Encounter for screening for malignant neoplasm of cervix: Secondary | ICD-10-CM | POA: Diagnosis not present

## 2020-05-08 DIAGNOSIS — I110 Hypertensive heart disease with heart failure: Secondary | ICD-10-CM | POA: Diagnosis not present

## 2020-06-02 DIAGNOSIS — M549 Dorsalgia, unspecified: Secondary | ICD-10-CM | POA: Diagnosis not present

## 2020-06-02 DIAGNOSIS — E559 Vitamin D deficiency, unspecified: Secondary | ICD-10-CM | POA: Diagnosis not present

## 2020-06-02 DIAGNOSIS — M889 Osteitis deformans of unspecified bone: Secondary | ICD-10-CM | POA: Diagnosis not present

## 2020-06-02 DIAGNOSIS — M1990 Unspecified osteoarthritis, unspecified site: Secondary | ICD-10-CM | POA: Diagnosis not present

## 2020-12-04 ENCOUNTER — Encounter: Payer: Self-pay | Admitting: Pulmonary Disease

## 2020-12-04 ENCOUNTER — Ambulatory Visit: Payer: BC Managed Care – PPO | Admitting: Pulmonary Disease

## 2020-12-04 ENCOUNTER — Ambulatory Visit (INDEPENDENT_AMBULATORY_CARE_PROVIDER_SITE_OTHER): Payer: No Typology Code available for payment source

## 2020-12-04 ENCOUNTER — Other Ambulatory Visit: Payer: Self-pay

## 2020-12-04 ENCOUNTER — Telehealth: Payer: Self-pay | Admitting: Pulmonary Disease

## 2020-12-04 VITALS — BP 142/80 | HR 73 | Temp 98.2°F | Ht 63.0 in | Wt 204.6 lb

## 2020-12-04 DIAGNOSIS — R059 Cough, unspecified: Secondary | ICD-10-CM

## 2020-12-04 LAB — CBC WITH DIFFERENTIAL/PLATELET
Basophils Absolute: 0.1 10*3/uL (ref 0.0–0.1)
Basophils Relative: 0.9 % (ref 0.0–3.0)
Eosinophils Absolute: 0.1 10*3/uL (ref 0.0–0.7)
Eosinophils Relative: 2.4 % (ref 0.0–5.0)
HCT: 40.6 % (ref 36.0–46.0)
Hemoglobin: 13.3 g/dL (ref 12.0–15.0)
Lymphocytes Relative: 31.1 % (ref 12.0–46.0)
Lymphs Abs: 1.7 10*3/uL (ref 0.7–4.0)
MCHC: 32.7 g/dL (ref 30.0–36.0)
MCV: 80.5 fl (ref 78.0–100.0)
Monocytes Absolute: 0.4 10*3/uL (ref 0.1–1.0)
Monocytes Relative: 7 % (ref 3.0–12.0)
Neutro Abs: 3.3 10*3/uL (ref 1.4–7.7)
Neutrophils Relative %: 58.6 % (ref 43.0–77.0)
Platelets: 163 10*3/uL (ref 150.0–400.0)
RBC: 5.04 Mil/uL (ref 3.87–5.11)
RDW: 14.4 % (ref 11.5–15.5)
WBC: 5.6 10*3/uL (ref 4.0–10.5)

## 2020-12-04 MED ORDER — ALBUTEROL SULFATE HFA 108 (90 BASE) MCG/ACT IN AERS
2.0000 | INHALATION_SPRAY | Freq: Four times a day (QID) | RESPIRATORY_TRACT | 11 refills | Status: DC | PRN
Start: 2020-12-04 — End: 2021-12-08

## 2020-12-04 MED ORDER — FLUTICASONE PROPIONATE 50 MCG/ACT NA SUSP: NASAL | 3 refills | Status: AC

## 2020-12-04 MED ORDER — BUDESONIDE-FORMOTEROL FUMARATE 80-4.5 MCG/ACT IN AERO: 2.0000 | INHALATION_SPRAY | Freq: Two times a day (BID) | RESPIRATORY_TRACT | 5 refills | Status: DC

## 2020-12-04 NOTE — Progress Notes (Signed)
Megan Nicholson    [id number]    09/30/1955  Primary Care Physician:Shaw, Rockney Ghee, MD  Referring Physician: Lupita Raider, MD [address] [place],  [place] [address]   Chief complaint: Follow-up for cough  HPI: 65 year old with past medical history of hypertension, hyperlipidemia, diabetes. She has complains of cough for the past 2-3 years. This is nonproductive in nature and is most evident while eating. Denies any dyspnea, wheezing.she has symptoms of allergic rhinitis with postnasal drip, GERD. She had a recent evaluation with barium swallow which showed stricture.  She had an EGD by Dr. Maryjane [facility] in April 2019which showed benign esophageal stricture with gastritis.  She underwent dilation and is on omeprazole for acid suppression.  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  Symptoms are slightly worsening during the pollen season She is on PPI twice daily, Flonase and is taking Allegra intermittently for postnasal drip  Outpatient Encounter Medications as of 12/04/2020  Medication Sig  . amLODipine (NORVASC) 10 MG tablet Take 10 mg by mouth daily.  Marland Kitchen atorvastatin (LIPITOR) 80 MG tablet Take 80 mg by mouth daily.  Marland Kitchen azelastine (ASTELIN) 0.1 % nasal spray PLACE 2 SPRAYS INTO BOTH NOSTRILS 2 (TWO) TIMES DAILY. USE IN EACH NOSTRIL AS DIRECTED  . chlorpheniramine (CHLOR-TRIMETON) 4 MG tablet Take 2 tablets (8 mg total) by mouth 3 (three) times daily.  Marland Kitchen glipiZIDE-metformin (METAGLIP) 5-500 MG tablet Take 1 tablet by mouth 2 (two) times daily before a meal.  . [DISCONTINUED] albuterol (PROVENTIL HFA;VENTOLIN HFA) 108 (90 Base) MCG/ACT inhaler Inhale 2 puffs into the lungs every 6 (six) hours as needed for wheezing or shortness of breath.  . [DISCONTINUED] fluticasone  (FLONASE) 50 MCG/ACT nasal spray SPRAY 2 SPRAYS INTO EACH NOSTRIL EVERY DAY  . albuterol (VENTOLIN HFA) 108 (90 Base) MCG/ACT inhaler Inhale 2 puffs into the lungs every 6 (six) hours as needed for wheezing or shortness of breath.  . fluticasone (FLONASE) 50 MCG/ACT nasal spray SPRAY 2 SPRAYS INTO EACH NOSTRIL EVERY DAY   No facility-administered encounter medications on file as of 12/04/2020.   Physical Exam: Blood pressure (!) 142/80, pulse 73, temperature 98.2 F (36.8 C), temperature source Temporal, height 5\' 3"  (1.6 m), weight 204 lb 9.6 oz (92.8 kg), SpO2 95 %. Gen:      No acute distress HEENT:  EOMI, sclera anicteric Neck:     No masses; no thyromegaly Lungs:    Clear to auscultation bilaterally; normal respiratory effort CV:         Regular rate and rhythm; no murmurs Abd:      + bowel sounds; soft, non-tender; no palpable masses, no distension Ext:    No edema; adequate peripheral perfusion Skin:      Warm and dry; no rash Neuro: alert and oriented x 3 Psych: normal mood and affect  Data Reviewed: Imaging: Chest x-ray 04/07/17-no acute cardiopulmonary abnormality.   Chest x-ray 05/11/17-no acute cardiopulmonary abnormality Reviewed images personally  Barium swallow 04/14/17- Stasis of 13 mm barium tablet in the distal esophagus, compatible with stricture. No abrupt shouldering or mucosal irregularity seen on single or double contrast imaging to raise concern for neoplasm. Upper endoscopy may prove helpful to further evaluate.  Tiny Sliding type hiatal hernia.  PFTs: 06/08/17 FVC 1.7 [6 6%], FEV1 1.51 [94%], F/F  89, TLC 76%, DLCO 67%, DLCO/VA 105% Minimal restriction, reduced diffusion capacity which corrects for alveolar volume  FENO 05/11/17- 8  GI EGD 05/19/17 Normal larynx, hiatal hernia Benign esophageal stenosis-dilated, chronic gastritis  Assessment:  Chronic cough Has upper airway cough from GERD and esophageal stricture. She may have a component of  postnasal drip, rhinitis.   PFTs reviewed.  There is no evidence of obstruction or bronchodilator response.  There was minimal restriction and diffusion impairment that corrects for alveolar volume.  I suspect this is secondary to her body habitus.  There is no evidence of interstitial lung disease on chest x-ray  Continue Flonase, Allegra  Given persistent symptoms we will reevaluate for asthma with CBC differential, IgE and chest x-ray Schedule PFTs Trial of Symbicort  GERD, esophageal stricture Continue protonix twice daily.  Follow-up with GI  Plan/Recommendations: - CBC, IgE, chest x-ray - Symbicort - PFTs  Chilton Greathouse MD [facility] 12/04/2020, 9:35 AM

## 2020-12-04 NOTE — Telephone Encounter (Signed)
Called and spoke with pt who stated that she was told cost for her symbicort was $162. Pt said that the pharmacy has sent her a coupon so she is unsure what the cost will be after the coupon.  Stated to pt if cost even after the coupon if the cost was still too much that she would need to contact insurance company to have them provide her a formulary list of covered inhalers and she verbalized understanding. Nothing further needed.

## 2020-12-04 NOTE — Progress Notes (Deleted)
Megan Nicholson    [id number]    February 17, 1956  Primary Care Physician:Shaw, Rockney Ghee, MD  Referring Physician: Lupita Raider, MD [address] [place],  [place] [address]   Chief complaint: Follow-up for cough  HPI: 65 year old with past medical history of hypertension, hyperlipidemia, diabetes. She has complains of cough for the past 2-3 years. This is nonproductive in nature and is most evident while eating. Denies any dyspnea, wheezing.she has symptoms of allergic rhinitis with postnasal drip, GERD. She had a recent evaluation with barium swallow which showed stricture.  She had an EGD by Dr. Maryjane [facility] in April 2019 which showed benign esophageal stricture with gastritis.  She underwent dilation and is on omeprazole for acid suppression.  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  States that her cough is improved overall but still has some symptoms especially at night when she has increased postnasal drainage.  Outpatient Encounter Medications as of 12/04/2020  Medication Sig  . amLODipine (NORVASC) 10 MG tablet Take 10 mg by mouth daily.  Marland Kitchen atorvastatin (LIPITOR) 80 MG tablet Take 80 mg by mouth daily.  Marland Kitchen azelastine (ASTELIN) 0.1 % nasal spray PLACE 2 SPRAYS INTO BOTH NOSTRILS 2 (TWO) TIMES DAILY. USE IN EACH NOSTRIL AS DIRECTED  . chlorpheniramine (CHLOR-TRIMETON) 4 MG tablet Take 2 tablets (8 mg total) by mouth 3 (three) times daily.  Marland Kitchen glipiZIDE-metformin (METAGLIP) 5-500 MG tablet Take 1 tablet by mouth 2 (two) times daily before a meal.  . [DISCONTINUED] albuterol (PROVENTIL HFA;VENTOLIN HFA) 108 (90 Base) MCG/ACT inhaler Inhale 2 puffs into the lungs every 6 (six) hours as needed for wheezing or shortness of breath.  . [DISCONTINUED] fluticasone (FLONASE) 50 MCG/ACT nasal spray  SPRAY 2 SPRAYS INTO EACH NOSTRIL EVERY DAY  . albuterol (VENTOLIN HFA) 108 (90 Base) MCG/ACT inhaler Inhale 2 puffs into the lungs every 6 (six) hours as needed for wheezing or shortness of breath.  . fluticasone (FLONASE) 50 MCG/ACT nasal spray SPRAY 2 SPRAYS INTO EACH NOSTRIL EVERY DAY   No facility-administered encounter medications on file as of 12/04/2020.   Physical Exam: Blood pressure 140/88, height 5\' 3"  (1.6 m), weight 190 lb 9.6 oz (86.5 kg), SpO2 93 %. Gen:      No acute distress HEENT:  EOMI, sclera anicteric Neck:     No masses; no thyromegaly Lungs:    Clear to auscultation bilaterally; normal respiratory effort CV:         Regular rate and rhythm; no murmurs Abd:      + bowel sounds; soft, non-tender; no palpable masses, no distension Ext:    No edema; adequate peripheral perfusion Skin:      Warm and dry; no rash Neuro: alert and oriented x 3 Psych: normal mood and affect  Data Reviewed: Imaging: Chest x-ray 04/07/17-no acute cardiopulmonary abnormality.   Chest x-ray 05/11/17-no acute cardiopulmonary abnormality Reviewed images personally  Barium swallow 04/14/17- Stasis of 13 mm barium tablet in the distal esophagus, compatible with stricture. No abrupt shouldering or mucosal irregularity seen on single or double contrast imaging to raise concern for neoplasm. Upper endoscopy may prove helpful to further evaluate.  Tiny Sliding type hiatal hernia.  PFTs: 06/08/17 FVC 1.7 [6 6%], FEV1 1.51 [94%], F/F 89, TLC 76%, DLCO 67%, DLCO/VA 105% Minimal restriction, reduced diffusion capacity which corrects for  alveolar volume  FENO 05/11/17- 8  GI EGD 05/19/17 Normal larynx, hiatal hernia Benign esophageal stenosis-dilated, chronic gastritis  Assessment:  Chronic cough Has upper airway cough from GERD and esophageal stricture. She may have a component of postnasal drip, rhinitis. Suspicion for asthma as low as symptoms are not very typical and FENO is normal. PFTs  reviewed.  There is no evidence of obstruction or bronchodilator response.  There was minimal restriction and diffusion impairment that corrects for alveolar volume.  I suspect this is secondary to her body habitus.  There is no evidence of interstitial lung disease on chest x-ray  Continue Flonase, chlorpheniramine, Astelin  GERD, esophageal stricture Continue protonix.  Follow-up with GI  Plan/Recommendations: - Chlorphentermine, Astelin nasal spray, Flonase - Continue Protonix, follow up with GI  Chilton Greathouse MD [facility] 12/04/2020, 9:36 AM

## 2020-12-04 NOTE — Patient Instructions (Signed)
We will check some labs today including CBC differential, IgE Chest x-ray today Start Symbicort 80/4.5  Schedule PFTs in the next 6 months and follow-up in clinic

## 2020-12-07 LAB — IGE: IgE (Immunoglobulin E), Serum: 36 kU/L (ref <=114)

## 2020-12-09 ENCOUNTER — Encounter: Payer: Self-pay | Admitting: *Deleted

## 2021-06-03 ENCOUNTER — Other Ambulatory Visit: Payer: Self-pay | Admitting: Internal Medicine

## 2021-06-03 ENCOUNTER — Ambulatory Visit
Admission: RE | Admit: 2021-06-03 | Discharge: 2021-06-03 | Disposition: A | Discharge status: Home / Self Care | Payer: No Typology Code available for payment source | Source: Ambulatory Visit | Attending: Internal Medicine | Admitting: Internal Medicine

## 2021-06-03 ENCOUNTER — Other Ambulatory Visit: Payer: Self-pay

## 2021-06-03 DIAGNOSIS — M7989 Other specified soft tissue disorders: Secondary | ICD-10-CM

## 2021-06-17 ENCOUNTER — Other Ambulatory Visit: Payer: Self-pay

## 2021-06-17 ENCOUNTER — Ambulatory Visit (INDEPENDENT_AMBULATORY_CARE_PROVIDER_SITE_OTHER): Payer: No Typology Code available for payment source | Admitting: Pulmonary Disease

## 2021-06-17 ENCOUNTER — Encounter: Payer: Self-pay | Admitting: Pulmonary Disease

## 2021-06-17 ENCOUNTER — Ambulatory Visit (INDEPENDENT_AMBULATORY_CARE_PROVIDER_SITE_OTHER): Payer: No Typology Code available for payment source | Admitting: Student

## 2021-06-17 VITALS — BP 146/82 | HR 84 | Temp 97.8°F | Ht 63.0 in | Wt 187.0 lb

## 2021-06-17 DIAGNOSIS — R053 Chronic cough: Secondary | ICD-10-CM

## 2021-06-17 DIAGNOSIS — R059 Cough, unspecified: Secondary | ICD-10-CM

## 2021-06-17 LAB — PULMONARY FUNCTION TEST
DL/VA % pred: 136 %
DL/VA: 5.75 ml/min/mmHg/L
DLCO cor % pred: 106 %
DLCO cor: 20.59 ml/min/mmHg
DLCO unc % pred: 106 %
DLCO unc: 20.59 ml/min/mmHg
FEF 25-75 Post: 0.94 L/sec
FEF 25-75 Pre: 2.49 L/sec
FEF2575-%Change-Post: -62 %
FEF2575-%Pred-Post: 50 %
FEF2575-%Pred-Pre: 131 %
FEV1-%Change-Post: -15 %
FEV1-%Pred-Post: 76 %
FEV1-%Pred-Pre: 91 %
FEV1-Post: 1.48 L
FEV1-Pre: 1.76 L
FEV1FVC-%Change-Post: 2 %
FEV1FVC-%Pred-Pre: 111 %
FEV6-%Change-Post: -17 %
FEV6-%Pred-Post: 69 %
FEV6-%Pred-Pre: 84 %
FEV6-Post: 1.65 L
FEV6-Pre: 2 L
FEV6FVC-%Pred-Post: 103 %
FEV6FVC-%Pred-Pre: 103 %
FVC-%Change-Post: -17 %
FVC-%Pred-Post: 66 %
FVC-%Pred-Pre: 81 %
FVC-Post: 1.65 L
FVC-Pre: 2 L
Post FEV1/FVC ratio: 90 %
Post FEV6/FVC ratio: 100 %
Pre FEV1/FVC ratio: 88 %
Pre FEV6/FVC Ratio: 100 %
RV % pred: 155 %
RV: 3.11 L
TLC % pred: 117 %
TLC: 5.74 L

## 2021-06-17 NOTE — Patient Instructions (Signed)
Continue the Symbicort Use 2 puffs in the morning 2 puffs at night Continue medications for acid reflux  Follow-up in 6 months

## 2021-06-17 NOTE — Progress Notes (Signed)
Megan Nicholson    [id number]    [birth year]  Primary Care Physician:Shaw, Rockney Ghee, MD  Referring Physician: Lupita Raider, MD [address] [place],  [place] [address]   Chief complaint: Follow-up for cough  HPI: 65 year old with past medical history of hypertension, hyperlipidemia, diabetes. She has complains of cough for the past 2-3 years. This is nonproductive in nature and is most evident while eating. Denies any dyspnea, wheezing.she has symptoms of allergic rhinitis with postnasal drip, GERD. She had a recent evaluation with barium swallow which showed stricture.  She had an EGD by Dr. Maryjane [facility] in April 2019which showed benign esophageal stricture with gastritis.  She underwent dilation and is on omeprazole for acid suppression.  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   amLODipine (NORVASC) 10 MG tablet Take 10 mg by mouth daily.   atorvastatin (LIPITOR) 80 MG tablet Take 80 mg by mouth daily.   azelastine (ASTELIN) 0.1 % nasal spray PLACE 2 SPRAYS INTO BOTH NOSTRILS 2 (TWO) TIMES DAILY. USE IN EACH NOSTRIL AS DIRECTED   budesonide-formoterol (SYMBICORT) 80-4.5 MCG/ACT inhaler Inhale 2 puffs into the lungs in the morning and at bedtime.   chlorpheniramine (CHLOR-TRIMETON) 4 MG tablet Take 2 tablets (8 mg total) by mouth 3 (three) times daily.   fluticasone (FLONASE) 50 MCG/ACT nasal spray SPRAY 2 SPRAYS  INTO EACH NOSTRIL EVERY DAY   glipiZIDE-metformin (METAGLIP) 5-500 MG tablet Take 1 tablet by mouth 2 (two) times daily before a meal.   No facility-administered encounter medications on file as of 06/17/2021.   Physical Exam: Blood pressure (!) 146/82, pulse 84, temperature 97.8 F (36.6 C), temperature source Oral, height 5\' 3"  (1.6 m), weight 187 lb (84.8 kg), SpO2 95 %. Gen:      No acute distress HEENT:  EOMI, sclera anicteric Neck:     No masses; no thyromegaly Lungs:    Clear to auscultation bilaterally; normal respiratory effort CV:         Regular rate and rhythm; no murmurs Abd:      + bowel sounds; soft, non-tender; no palpable masses, no distension Ext:    No edema; adequate peripheral perfusion Skin:      Warm and dry; no rash Neuro: alert and oriented x 3 Psych: normal mood and affect   Data Reviewed: Imaging: Chest x-ray 04/07/17-no acute cardiopulmonary abnormality.   Chest x-ray 05/11/17-no acute cardiopulmonary abnormality Chest x-ray 12/04/2020-no active cardiopulmonary disease Reviewed images personally  Barium swallow 04/14/17- Stasis of 13 mm barium tablet in the distal esophagus, compatible with stricture. No abrupt shouldering or mucosal irregularity seen on single or double contrast imaging to raise concern for neoplasm. Upper endoscopy may prove helpful to further evaluate.   Tiny Sliding type hiatal hernia.  PFTs: 06/08/17 FVC 1.7 [6 6%], FEV1 1.51 [94%], F/F 89, TLC 76%, DLCO 67%, DLCO/VA 105% Minimal restriction, reduced diffusion capacity which corrects for alveolar volume  06/17/2021 FVC 1.65 [6%], FEV1 1.48 [76%], F/F 90, TLC 5.74 [117%], DLCO 20.59 [106%] Air trapping, no obstruction.  Normal lung volumes and diffusion defect  FENO 05/11/17- 8  Labs: CBC 12/04/2020-WBC 5.6, eos 2.4%, absolute eosinophil count 134 IgE 12/04/2020-36  GI EGD 05/19/17 Normal larynx, hiatal hernia Benign esophageal stenosis-dilated, chronic  gastritis  Assessment:  Chronic cough Has upper airway cough from GERD and esophageal stricture. She may have a component of postnasal drip, rhinitis.   PFTs reviewed with air trapping suggestive of small airways disease.  There is no overt obstruction.  Symbicort seems to be working for her and I will continue for now I have told her to take it twice a day instead of just at night Continue albuterol as needed Treat postnasal drip with Flonase, Allegra  GERD, esophageal stricture Continue protonix twice daily.  Follow-up with GI  Plan/Recommendations: - Continue Symbicort.  Use 2 puffs twice daily  Chilton Greathouse MD [facility] 06/17/2021, 3:17 PM

## 2021-06-17 NOTE — Patient Instructions (Signed)
Full PFT performed today. °

## 2021-06-17 NOTE — Progress Notes (Signed)
Full PFT performed today. °

## 2021-12-01 ENCOUNTER — Ambulatory Visit: Payer: No Typology Code available for payment source | Admitting: Pulmonary Disease

## 2021-12-08 ENCOUNTER — Ambulatory Visit
Admission: RE | Admit: 2021-12-08 | Discharge: 2021-12-08 | Disposition: A | Discharge status: Home / Self Care | Payer: 59 | Source: Ambulatory Visit | Attending: Family Medicine | Admitting: Family Medicine

## 2021-12-08 ENCOUNTER — Other Ambulatory Visit: Payer: Self-pay | Admitting: Pulmonary Disease

## 2021-12-08 ENCOUNTER — Other Ambulatory Visit: Payer: Self-pay | Admitting: Family Medicine

## 2021-12-08 DIAGNOSIS — R042 Hemoptysis: Secondary | ICD-10-CM

## 2021-12-08 DIAGNOSIS — R059 Cough, unspecified: Secondary | ICD-10-CM | POA: Diagnosis not present

## 2021-12-08 DIAGNOSIS — R809 Proteinuria, unspecified: Secondary | ICD-10-CM | POA: Diagnosis not present

## 2021-12-27 ENCOUNTER — Encounter: Payer: Self-pay | Admitting: Pulmonary Disease

## 2021-12-27 ENCOUNTER — Ambulatory Visit: Payer: 59 | Admitting: Pulmonary Disease

## 2021-12-27 VITALS — BP 126/80 | HR 75 | Temp 98.1°F | Ht 63.0 in | Wt 213.0 lb

## 2021-12-27 DIAGNOSIS — J4540 Moderate persistent asthma, uncomplicated: Secondary | ICD-10-CM | POA: Diagnosis not present

## 2021-12-27 NOTE — Progress Notes (Signed)
Megan Nicholson    [id number]    Apr 10, 1956  Primary Care Physician:Shaw, Rockney Ghee, MD  Referring Physician: Lupita Raider, MD [address] [place],  [place] [address]   Chief complaint: Follow-up for asthma  HPI: 66 year old with past medical history of hypertension, hyperlipidemia, diabetes. She has complains of cough for the past 2-3 years. This is nonproductive in nature and is most evident while eating. Denies any dyspnea, wheezing.she has symptoms of allergic rhinitis with postnasal drip, GERD. She had a recent evaluation with barium swallow which showed stricture.  She had an EGD by Dr. [facility]jane [facility] in April 2019which showed benign esophageal stricture with gastritis.  She underwent dilation and is on omeprazole for acid suppression.  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  She is doing well with the inhaler She had a mild flareup 2 weeks ago due to pollen.  Was seen by primary care who treated her with prednisone.  She is back to baseline now.  Outpatient Encounter Medications as of 12/27/2021  Medication Sig   albuterol (VENTOLIN HFA) 108 (90 Base) MCG/ACT inhaler TAKE 2 PUFFS BY MOUTH EVERY 6 HOURS AS NEEDED FOR WHEEZE OR SHORTNESS OF BREATH   amLODipine (NORVASC) 10 MG tablet Take 10 mg by mouth daily.   atorvastatin (LIPITOR) 80 MG tablet Take 80 mg by mouth daily.   azelastine (ASTELIN) 0.1 % nasal spray PLACE 2 SPRAYS INTO BOTH NOSTRILS 2 (TWO) TIMES DAILY. USE IN EACH NOSTRIL AS DIRECTED   budesonide-formoterol (SYMBICORT) 80-4.5 MCG/ACT inhaler Inhale 2 puffs into the lungs in the morning and at bedtime.   chlorpheniramine (CHLOR-TRIMETON) 4 MG tablet Take 2 tablets (8 mg total) by mouth 3 (three) times daily.   fluticasone (FLONASE) 50 MCG/ACT nasal spray SPRAY 2 SPRAYS INTO EACH  NOSTRIL EVERY DAY   glipiZIDE-metformin (METAGLIP) 5-500 MG tablet Take 1 tablet by mouth 2 (two) times daily before a meal.   Semaglutide (RYBELSUS) 3 MG TABS Take by mouth.   No facility-administered encounter medications on file as of 12/27/2021.   Physical Exam: Blood pressure 126/80, pulse 75, temperature 98.1 F (36.7 C), temperature source Oral, height 5\' 3"  (1.6 m), weight 213 lb (96.6 kg), SpO2 95 %. Gen:      No acute distress HEENT:  EOMI, sclera anicteric Neck:     No masses; no thyromegaly Lungs:    Clear to auscultation bilaterally; normal respiratory effort CV:         Regular rate and rhythm; no murmurs Abd:      + bowel sounds; soft, non-tender; no palpable masses, no distension Ext:    No edema; adequate peripheral perfusion Skin:      Warm and dry; no rash Neuro: alert and oriented x 3 Psych: normal mood and affect   Data Reviewed: Imaging: Chest x-ray 04/07/17-no acute cardiopulmonary abnormality.   Chest x-ray 05/11/17-no acute cardiopulmonary abnormality Chest x-ray 12/04/2020-no active cardiopulmonary disease Reviewed images personally  Barium swallow 04/14/17- Stasis of 13 mm barium tablet in the distal esophagus, compatible with stricture. No abrupt shouldering or mucosal irregularity seen on single or double contrast imaging to raise concern for neoplasm. Upper endoscopy may prove helpful to further evaluate.   Tiny Sliding type hiatal hernia.  PFTs: 06/08/17 FVC 1.7 [6 6%], FEV1 1.51 [94%], F/F 89, TLC 76%, DLCO 67%,  DLCO/VA 105% Minimal restriction, reduced diffusion capacity which corrects for alveolar volume  06/17/2021 FVC 1.65 [6%], FEV1 1.48 [76%], F/F 90, TLC 5.74 [117%], DLCO 20.59 [106%] Air trapping, no obstruction.  Normal lung volumes and diffusion defect  FENO 05/11/17- 8  Labs: CBC 12/04/2020-WBC 5.6, eos 2.4%, absolute eosinophil count 134 IgE 12/04/2020-36  GI EGD 05/19/17 Normal larynx, hiatal hernia Benign esophageal  stenosis-dilated, chronic gastritis  Assessment:  Chronic cough Has upper airway cough from GERD and esophageal stricture. She may have a component of postnasal drip, rhinitis.   PFTs reviewed with air trapping suggestive of small airways disease.  There is no overt obstruction.  Symbicort seems to be working for her and I will continue for now Continue albuterol as needed Treat postnasal drip with Flonase, Allegra  GERD, esophageal stricture Continue protonix twice daily.  Follow-up with GI  Plan/Recommendations: - Continue Symbicort.   Chilton Greathouse MD [facility] 12/27/2021, 2:49 PM

## 2021-12-27 NOTE — Patient Instructions (Signed)
I am glad you are doing well with regard to breathing Continue the Symbicort Follow-up in 6 months.

## 2022-01-31 DIAGNOSIS — Z1231 Encounter for screening mammogram for malignant neoplasm of breast: Secondary | ICD-10-CM | POA: Diagnosis not present

## 2022-03-15 DIAGNOSIS — E119 Type 2 diabetes mellitus without complications: Secondary | ICD-10-CM | POA: Diagnosis not present

## 2022-03-15 DIAGNOSIS — M8589 Other specified disorders of bone density and structure, multiple sites: Secondary | ICD-10-CM | POA: Diagnosis not present

## 2022-03-15 DIAGNOSIS — E559 Vitamin D deficiency, unspecified: Secondary | ICD-10-CM | POA: Diagnosis not present

## 2022-03-15 DIAGNOSIS — M1990 Unspecified osteoarthritis, unspecified site: Secondary | ICD-10-CM | POA: Diagnosis not present

## 2022-03-15 DIAGNOSIS — I10 Essential (primary) hypertension: Secondary | ICD-10-CM | POA: Diagnosis not present

## 2022-03-15 DIAGNOSIS — M549 Dorsalgia, unspecified: Secondary | ICD-10-CM | POA: Diagnosis not present

## 2022-03-15 DIAGNOSIS — M889 Osteitis deformans of unspecified bone: Secondary | ICD-10-CM | POA: Diagnosis not present

## 2022-03-15 DIAGNOSIS — M25559 Pain in unspecified hip: Secondary | ICD-10-CM | POA: Diagnosis not present

## 2022-03-15 DIAGNOSIS — M25512 Pain in left shoulder: Secondary | ICD-10-CM | POA: Diagnosis not present

## 2022-03-15 DIAGNOSIS — M8580 Other specified disorders of bone density and structure, unspecified site: Secondary | ICD-10-CM | POA: Diagnosis not present

## 2022-08-06 DIAGNOSIS — R051 Acute cough: Secondary | ICD-10-CM | POA: Diagnosis not present

## 2023-02-02 DIAGNOSIS — Z1231 Encounter for screening mammogram for malignant neoplasm of breast: Secondary | ICD-10-CM | POA: Diagnosis not present

## 2023-02-13 DIAGNOSIS — M1611 Unilateral primary osteoarthritis, right hip: Secondary | ICD-10-CM | POA: Diagnosis not present

## 2023-02-13 DIAGNOSIS — M7061 Trochanteric bursitis, right hip: Secondary | ICD-10-CM | POA: Diagnosis not present

## 2023-03-01 DIAGNOSIS — M25551 Pain in right hip: Secondary | ICD-10-CM | POA: Diagnosis not present

## 2023-03-06 DIAGNOSIS — Z886 Allergy status to analgesic agent status: Secondary | ICD-10-CM | POA: Diagnosis not present

## 2023-03-06 DIAGNOSIS — Z6836 Body mass index (BMI) 36.0-36.9, adult: Secondary | ICD-10-CM | POA: Diagnosis not present

## 2023-03-06 DIAGNOSIS — I129 Hypertensive chronic kidney disease with stage 1 through stage 4 chronic kidney disease, or unspecified chronic kidney disease: Secondary | ICD-10-CM | POA: Diagnosis not present

## 2023-03-06 DIAGNOSIS — K219 Gastro-esophageal reflux disease without esophagitis: Secondary | ICD-10-CM | POA: Diagnosis not present

## 2023-03-06 DIAGNOSIS — Z8249 Family history of ischemic heart disease and other diseases of the circulatory system: Secondary | ICD-10-CM | POA: Diagnosis not present

## 2023-03-06 DIAGNOSIS — Z833 Family history of diabetes mellitus: Secondary | ICD-10-CM | POA: Diagnosis not present

## 2023-03-06 DIAGNOSIS — E785 Hyperlipidemia, unspecified: Secondary | ICD-10-CM | POA: Diagnosis not present

## 2023-03-06 DIAGNOSIS — E1122 Type 2 diabetes mellitus with diabetic chronic kidney disease: Secondary | ICD-10-CM | POA: Diagnosis not present

## 2023-03-06 DIAGNOSIS — Z7984 Long term (current) use of oral hypoglycemic drugs: Secondary | ICD-10-CM | POA: Diagnosis not present

## 2023-03-06 DIAGNOSIS — M1990 Unspecified osteoarthritis, unspecified site: Secondary | ICD-10-CM | POA: Diagnosis not present

## 2023-03-06 DIAGNOSIS — N181 Chronic kidney disease, stage 1: Secondary | ICD-10-CM | POA: Diagnosis not present

## 2023-06-05 DIAGNOSIS — J301 Allergic rhinitis due to pollen: Secondary | ICD-10-CM | POA: Diagnosis not present

## 2023-06-05 DIAGNOSIS — M889 Osteitis deformans of unspecified bone: Secondary | ICD-10-CM | POA: Diagnosis not present

## 2023-06-05 DIAGNOSIS — E782 Mixed hyperlipidemia: Secondary | ICD-10-CM | POA: Diagnosis not present

## 2023-06-05 DIAGNOSIS — E119 Type 2 diabetes mellitus without complications: Secondary | ICD-10-CM | POA: Diagnosis not present

## 2023-06-05 DIAGNOSIS — E1169 Type 2 diabetes mellitus with other specified complication: Secondary | ICD-10-CM | POA: Diagnosis not present

## 2023-06-05 DIAGNOSIS — J45991 Cough variant asthma: Secondary | ICD-10-CM | POA: Diagnosis not present

## 2023-06-05 DIAGNOSIS — Z1159 Encounter for screening for other viral diseases: Secondary | ICD-10-CM | POA: Diagnosis not present

## 2023-06-05 DIAGNOSIS — E041 Nontoxic single thyroid nodule: Secondary | ICD-10-CM | POA: Diagnosis not present

## 2023-06-05 DIAGNOSIS — I519 Heart disease, unspecified: Secondary | ICD-10-CM | POA: Diagnosis not present

## 2023-06-05 DIAGNOSIS — Z0000 Encounter for general adult medical examination without abnormal findings: Secondary | ICD-10-CM | POA: Diagnosis not present

## 2023-06-05 DIAGNOSIS — Z23 Encounter for immunization: Secondary | ICD-10-CM | POA: Diagnosis not present

## 2023-06-05 DIAGNOSIS — I110 Hypertensive heart disease with heart failure: Secondary | ICD-10-CM | POA: Diagnosis not present

## 2023-06-05 DIAGNOSIS — K219 Gastro-esophageal reflux disease without esophagitis: Secondary | ICD-10-CM | POA: Diagnosis not present

## 2023-08-28 DIAGNOSIS — E559 Vitamin D deficiency, unspecified: Secondary | ICD-10-CM | POA: Diagnosis not present

## 2023-08-28 DIAGNOSIS — M889 Osteitis deformans of unspecified bone: Secondary | ICD-10-CM | POA: Diagnosis not present

## 2023-08-28 DIAGNOSIS — I10 Essential (primary) hypertension: Secondary | ICD-10-CM | POA: Diagnosis not present

## 2023-08-28 DIAGNOSIS — M1990 Unspecified osteoarthritis, unspecified site: Secondary | ICD-10-CM | POA: Diagnosis not present

## 2023-08-28 DIAGNOSIS — M25559 Pain in unspecified hip: Secondary | ICD-10-CM | POA: Diagnosis not present

## 2023-08-28 DIAGNOSIS — M8580 Other specified disorders of bone density and structure, unspecified site: Secondary | ICD-10-CM | POA: Diagnosis not present

## 2023-08-28 DIAGNOSIS — E119 Type 2 diabetes mellitus without complications: Secondary | ICD-10-CM | POA: Diagnosis not present

## 2023-11-02 DIAGNOSIS — H52203 Unspecified astigmatism, bilateral: Secondary | ICD-10-CM | POA: Diagnosis not present

## 2023-11-02 DIAGNOSIS — E119 Type 2 diabetes mellitus without complications: Secondary | ICD-10-CM | POA: Diagnosis not present

## 2023-11-02 DIAGNOSIS — H2513 Age-related nuclear cataract, bilateral: Secondary | ICD-10-CM | POA: Diagnosis not present

## 2023-11-02 DIAGNOSIS — H524 Presbyopia: Secondary | ICD-10-CM | POA: Diagnosis not present

## 2023-11-13 ENCOUNTER — Other Ambulatory Visit: Payer: Self-pay | Admitting: Pulmonary Disease

## 2023-12-04 DIAGNOSIS — Z1211 Encounter for screening for malignant neoplasm of colon: Secondary | ICD-10-CM | POA: Diagnosis not present

## 2023-12-04 DIAGNOSIS — J45991 Cough variant asthma: Secondary | ICD-10-CM | POA: Diagnosis not present

## 2023-12-04 DIAGNOSIS — I110 Hypertensive heart disease with heart failure: Secondary | ICD-10-CM | POA: Diagnosis not present

## 2023-12-04 DIAGNOSIS — Z23 Encounter for immunization: Secondary | ICD-10-CM | POA: Diagnosis not present

## 2023-12-04 DIAGNOSIS — E782 Mixed hyperlipidemia: Secondary | ICD-10-CM | POA: Diagnosis not present

## 2023-12-04 DIAGNOSIS — E1169 Type 2 diabetes mellitus with other specified complication: Secondary | ICD-10-CM | POA: Diagnosis not present

## 2024-01-06 DIAGNOSIS — E782 Mixed hyperlipidemia: Secondary | ICD-10-CM | POA: Diagnosis not present

## 2024-01-06 DIAGNOSIS — J45991 Cough variant asthma: Secondary | ICD-10-CM | POA: Diagnosis not present

## 2024-01-06 DIAGNOSIS — I110 Hypertensive heart disease with heart failure: Secondary | ICD-10-CM | POA: Diagnosis not present

## 2024-01-06 DIAGNOSIS — E1169 Type 2 diabetes mellitus with other specified complication: Secondary | ICD-10-CM | POA: Diagnosis not present

## 2024-01-17 DIAGNOSIS — K5730 Diverticulosis of large intestine without perforation or abscess without bleeding: Secondary | ICD-10-CM | POA: Diagnosis not present

## 2024-01-17 DIAGNOSIS — Z860100 Personal history of colon polyps, unspecified: Secondary | ICD-10-CM | POA: Diagnosis not present

## 2024-01-17 DIAGNOSIS — D120 Benign neoplasm of cecum: Secondary | ICD-10-CM | POA: Diagnosis not present

## 2024-01-17 DIAGNOSIS — Z09 Encounter for follow-up examination after completed treatment for conditions other than malignant neoplasm: Secondary | ICD-10-CM | POA: Diagnosis not present

## 2024-01-17 DIAGNOSIS — K621 Rectal polyp: Secondary | ICD-10-CM | POA: Diagnosis not present

## 2024-01-22 DIAGNOSIS — D120 Benign neoplasm of cecum: Secondary | ICD-10-CM | POA: Diagnosis not present

## 2024-01-22 DIAGNOSIS — K621 Rectal polyp: Secondary | ICD-10-CM | POA: Diagnosis not present

## 2024-02-05 DIAGNOSIS — Z1231 Encounter for screening mammogram for malignant neoplasm of breast: Secondary | ICD-10-CM | POA: Diagnosis not present

## 2024-02-05 DIAGNOSIS — E782 Mixed hyperlipidemia: Secondary | ICD-10-CM | POA: Diagnosis not present

## 2024-02-05 DIAGNOSIS — E1169 Type 2 diabetes mellitus with other specified complication: Secondary | ICD-10-CM | POA: Diagnosis not present

## 2024-02-05 DIAGNOSIS — J45991 Cough variant asthma: Secondary | ICD-10-CM | POA: Diagnosis not present

## 2024-02-05 DIAGNOSIS — I110 Hypertensive heart disease with heart failure: Secondary | ICD-10-CM | POA: Diagnosis not present

## 2024-03-07 DIAGNOSIS — E782 Mixed hyperlipidemia: Secondary | ICD-10-CM | POA: Diagnosis not present

## 2024-03-07 DIAGNOSIS — E1169 Type 2 diabetes mellitus with other specified complication: Secondary | ICD-10-CM | POA: Diagnosis not present

## 2024-03-07 DIAGNOSIS — I110 Hypertensive heart disease with heart failure: Secondary | ICD-10-CM | POA: Diagnosis not present

## 2024-03-07 DIAGNOSIS — J45991 Cough variant asthma: Secondary | ICD-10-CM | POA: Diagnosis not present

## 2024-03-14 ENCOUNTER — Encounter: Payer: Self-pay | Admitting: Primary Care

## 2024-03-14 ENCOUNTER — Ambulatory Visit: Admitting: Primary Care

## 2024-03-14 VITALS — BP 134/88 | HR 77 | Temp 98.0°F | Ht 63.0 in | Wt 192.0 lb

## 2024-03-14 DIAGNOSIS — J4540 Moderate persistent asthma, uncomplicated: Secondary | ICD-10-CM | POA: Diagnosis not present

## 2024-03-14 MED ORDER — BUDESONIDE-FORMOTEROL FUMARATE 80-4.5 MCG/ACT IN AERO: 2.0000 | INHALATION_SPRAY | Freq: Two times a day (BID) | RESPIRATORY_TRACT | 11 refills | Status: DC

## 2024-03-14 MED ORDER — CHLORPHENIRAMINE MALEATE 4 MG PO TABS: 8.0000 mg | ORAL_TABLET | Freq: Three times a day (TID) | ORAL | 2 refills | Status: AC

## 2024-03-14 NOTE — Patient Instructions (Signed)
  VISIT SUMMARY: You came in today for a follow-up visit regarding your moderate persistent asthma. You reported experiencing frequent asthma flare-ups with heavy coughing and occasional wheezing, and you have been using your albuterol  rescue inhaler during these episodes. You also mentioned that you have not been taking your Symbicort  daily as prescribed.  YOUR PLAN: -MODERATE PERSISTENT ASTHMA: Moderate persistent asthma is a type of asthma that causes symptoms like coughing and wheezing more than twice a week but not daily. To better control your asthma, you should use Symbicort  twice daily, in the morning and evening, as a maintenance inhaler.  It's important to use Symbicort  consistently to reduce inflammation and control your symptoms. Continue using albuterol  as a rescue inhaler 2 puffs every 4-6 hours for sudden breakthrough symptoms. Your prescriptions for Symbicort  and chlorphenamine have been refilled, and you have 11 refills for Symbicort .  INSTRUCTIONS: Please follow up as needed or if your symptoms worsen. Make sure to use Symbicort  twice daily and use your albuterol  rescue inhaler only for sudden symptoms.

## 2024-03-14 NOTE — Progress Notes (Signed)
 @Patient  ID: Megan Nicholson, female    DOB: 09/12/1955, 68 y.o.   MRN: [id number]  Chief Complaint  Patient presents with   Asthma    Pt complains of flair ups and she notices mild wheezing 2 days ago.     Referring provider: Loreli Kins, MD  HPI: 68 year old with past medical history of hypertension, hyperlipidemia, diabetes. She has complains of cough for the past 2-3 years. This is nonproductive in nature and is most evident while eating. Denies any dyspnea, wheezing.she has symptoms of allergic rhinitis with postnasal drip, GERD. She had a recent evaluation with barium swallow which showed stricture.  She had an EGD by Dr. Rosalie [facility]e [facility] in April 2019which showed benign esophageal stricture with gastritis.  She underwent dilation and is on omeprazole for acid suppression.  Pets: none Occupation:Shift manager at [employer]  Exposures:no known exposures  Smoking history:None Travel History:No recent travel   Previous [facility] pulmonary encounter 12/27/21- Dr. Theophilus Started on Symbicort  in November of 2022.  She is doing well with the inhaler She had a mild flareup 2 weeks ago due to pollen.  Was seen by primary care who treated her with prednisone.  She is back to baseline now.  Discussed the use of AI scribe software for clinical note transcription with the patient, who gave verbal consent to proceed.  History of Present Illness Megan Nicholson is a 68 year old female with moderate persistent asthma who presents for a follow-up visit.  She experiences frequent asthma flare-ups characterized by heavy coughing and difficulty taking deep breaths. Wheezing occurs occasionally, with the most recent episode noted last week. She uses her albuterol  rescue inhaler during these flare-ups, which helps to alleviate her symptoms.She is prescribed Symbicort  for her asthma but does not take it daily.  Additionally, she takes chlorphenamine for allergies, which was prescribed by a previous  doctor.  In the past month, her asthma has not prevented her from completing tasks at work or home. No shortness of breath is reported. Her asthma symptoms, including wheezing and coughing, have woken her up at night once a week over the past four weeks. She uses her rescue inhaler two to four times per week.  She rates her asthma control over the past four weeks as well controlled.  Allergies  Allergen Reactions   Meloxicam Hives    Immunization History  Administered Date(s) Administered   Influenza Split 05/10/2012, 04/29/2019   Influenza,inj,Quad PF,6+ Mos 06/22/2016, 05/12/2017, 04/13/2018, 04/11/2019, 05/08/2020, 05/20/2021   Influenza,inj,quad, With Preservative 08/12/2013, 04/30/2014, 05/06/2015, 04/13/2018   PFIZER(Purple Top)SARS-COV-2 Vaccination 11/01/2019, 11/22/2019   Pneumococcal Polysaccharide-23 11/09/2011   Td 12/06/2001   Tdap 09/21/2010    Past Medical History:  Diagnosis Date   Asthma    Whooping cough     Tobacco History: Social History   Tobacco Use  Smoking Status Never  Smokeless Tobacco Never   Counseling given: Not Answered   Outpatient Medications Prior to Visit  Medication Sig Dispense Refill   albuterol  (VENTOLIN  HFA) 108 (90 Base) MCG/ACT inhaler TAKE 2 PUFFS BY MOUTH EVERY 6 HOURS AS NEEDED FOR WHEEZE OR SHORTNESS OF BREATH 8.5 each 11   amLODipine (NORVASC) 10 MG tablet Take 10 mg by mouth daily.  2   atorvastatin (LIPITOR) 80 MG tablet Take 80 mg by mouth daily.  2   azelastine  (ASTELIN ) 0.1 % nasal spray PLACE 2 SPRAYS INTO BOTH NOSTRILS 2 (TWO) TIMES DAILY. USE IN EACH NOSTRIL AS DIRECTED 30 mL 3  budesonide -formoterol  (SYMBICORT ) 80-4.5 MCG/ACT inhaler Inhale 2 puffs into the lungs in the morning and at bedtime. 10.2 g 5   chlorpheniramine  (CHLOR-TRIMETON ) 4 MG tablet Take 2 tablets (8 mg total) by mouth 3 (three) times daily. 120 tablet 2   fluticasone  (FLONASE ) 50 MCG/ACT nasal spray SPRAY 2 SPRAYS INTO EACH NOSTRIL EVERY DAY 48 mL  3   glipiZIDE-metformin (METAGLIP) 5-500 MG tablet Take 1 tablet by mouth 2 (two) times daily before a meal.     Semaglutide (RYBELSUS) 3 MG TABS Take by mouth.     No facility-administered medications prior to visit.      Review of Systems  Review of Systems  HENT: Negative.    Respiratory:  Positive for cough and wheezing.    Physical Exam  BP (!) 158/94   Pulse 77   Temp 98 F (36.7 C)   Ht 5' 3 (1.6 m)   Wt 192 lb (87.1 kg)   SpO2 96%   BMI 34.01 kg/m  Physical Exam Constitutional:      Appearance: Normal appearance. She is well-developed.  HENT:     Head: Normocephalic and atraumatic.     Mouth/Throat:     Mouth: Mucous membranes are moist.     Pharynx: Oropharynx is clear.  Eyes:     Pupils: Pupils are equal, round, and reactive to light.  Cardiovascular:     Rate and Rhythm: Normal rate and regular rhythm.     Heart sounds: Normal heart sounds. No murmur heard. Pulmonary:     Effort: Pulmonary effort is normal. No respiratory distress.     Breath sounds: Normal breath sounds. No wheezing or rhonchi.  Abdominal:     General: Bowel sounds are normal.     Palpations: Abdomen is soft.     Tenderness: There is no abdominal tenderness.  Musculoskeletal:        General: Normal range of motion.     Cervical back: Normal range of motion and neck supple.  Skin:    General: Skin is warm and dry.     Findings: No erythema or rash.  Neurological:     General: No focal deficit present.     Mental Status: She is alert and oriented to person, place, and time. Mental status is at baseline.  Psychiatric:        Mood and Affect: Mood normal.        Behavior: Behavior normal.        Thought Content: Thought content normal.        Judgment: Judgment normal.      Lab Results:  CBC    Component Value Date/Time   WBC 5.6 12/04/2020 1000   RBC 5.04 12/04/2020 1000   HGB 13.3 12/04/2020 1000   HCT 40.6 12/04/2020 1000   PLT 163.0 12/04/2020 1000   MCV 80.5  12/04/2020 1000   MCHC 32.7 12/04/2020 1000   RDW 14.4 12/04/2020 1000   LYMPHSABS 1.7 12/04/2020 1000   MONOABS 0.4 12/04/2020 1000   EOSABS 0.1 12/04/2020 1000   BASOSABS 0.1 12/04/2020 1000    BMET No results found for: NA, K, CL, CO2, GLUCOSE, BUN, CREATININE, CALCIUM, GFRNONAA, GFRAA  BNP No results found for: BNP  ProBNP No results found for: PROBNP  Imaging: No results found.   Assessment & Plan:   1. Moderate persistent asthma without complication (Primary)   Assessment and Plan Assessment & Plan Moderate persistent asthma Moderate persistent asthma with symptoms primarily characterized by coughing fits and  occasional wheezing. Symptoms have been present for the past two years with intermittent flare-ups. She reports using albuterol  as a rescue inhaler during these episodes. She does not consistently use Symbicort . Symptoms are reported to be well controlled over the past four weeks, with asthma waking her up once a week and requiring rescue inhaler use two to four times per week. - Refill Symbicort  and chlorphenamine prescriptions. - Instruct to use Symbicort  80mcg two puffs twice daily, morning and evening, as a maintenance inhaler. - Educate on the importance of consistent Symbicort  use to control symptoms and reduce inflammation. - Instruct to use albuterol  as a rescue inhaler for breakthrough symptoms, not as a maintenance treatment. - FU in 1 year or sooner if needed    Megan LELON Ferrari, NP 03/14/2024

## 2024-03-15 ENCOUNTER — Other Ambulatory Visit: Payer: Self-pay | Admitting: Student

## 2024-03-15 ENCOUNTER — Ambulatory Visit
Admission: RE | Admit: 2024-03-15 | Discharge: 2024-03-15 | Disposition: A | Discharge status: Home / Self Care | Source: Ambulatory Visit | Attending: Student | Admitting: Student

## 2024-03-15 DIAGNOSIS — K5900 Constipation, unspecified: Secondary | ICD-10-CM | POA: Diagnosis not present

## 2024-03-15 DIAGNOSIS — R195 Other fecal abnormalities: Secondary | ICD-10-CM | POA: Diagnosis not present

## 2024-03-15 DIAGNOSIS — R141 Gas pain: Secondary | ICD-10-CM | POA: Diagnosis not present

## 2024-03-18 ENCOUNTER — Other Ambulatory Visit: Payer: Self-pay | Admitting: Primary Care

## 2024-03-18 ENCOUNTER — Telehealth: Payer: Self-pay

## 2024-03-18 ENCOUNTER — Other Ambulatory Visit (HOSPITAL_COMMUNITY): Payer: Self-pay

## 2024-03-18 NOTE — Telephone Encounter (Signed)
 Dr. Theophilus, Patient was on the generic for Symbicort , the preferred is the name brand Symbicort .  The patient has a high deductible and does not know how much she has left to pay.  I encouraged her to call her insurance company to find out what she had left to pay out of pocket until her deductible is met.  Currently, the Symbicort  will cost $205.25 and she cannot afford this.  [facility] is currently out of the offic for the week.  Please advise on how you would like to proceed.  Thank you.

## 2024-03-18 NOTE — Telephone Encounter (Signed)
 Dr. Theophilus, [facility] is out of the office for the week.  Please advise.  Patient is currently prescribed the generic for Symbicort  and the preferred is name brand Symbicort .  She has a high deductible plan and she is unsure how much she has to pay until it is met.  I advised her to call her Insurance company to find out.  Even ordering the name brand Symbicort , the cost will be $206.25 which the patient cannot afford.  How would you like to proceed?  Thank you.

## 2024-03-18 NOTE — Telephone Encounter (Signed)
 Pharmacy team, Please advise on alternative covered by insurance.  Patient's pharmacy needs an alternative, however did not provide alternatives.  Thank you.

## 2024-03-18 NOTE — Telephone Encounter (Signed)
 Brand Symbicort  is preferred-however patient has a deductible to meet causing the co-pay to be at a higher cost. Per test claim current cost is $206.25 for 30 days

## 2024-03-20 ENCOUNTER — Other Ambulatory Visit: Payer: Self-pay

## 2024-03-20 MED ORDER — BUDESONIDE-FORMOTEROL FUMARATE 80-4.5 MCG/ACT IN AERO: 2.0000 | INHALATION_SPRAY | Freq: Two times a day (BID) | RESPIRATORY_TRACT | 11 refills | Status: AC

## 2024-04-07 DIAGNOSIS — E1169 Type 2 diabetes mellitus with other specified complication: Secondary | ICD-10-CM | POA: Diagnosis not present

## 2024-04-07 DIAGNOSIS — E782 Mixed hyperlipidemia: Secondary | ICD-10-CM | POA: Diagnosis not present

## 2024-04-07 DIAGNOSIS — I110 Hypertensive heart disease with heart failure: Secondary | ICD-10-CM | POA: Diagnosis not present

## 2024-04-07 DIAGNOSIS — J45991 Cough variant asthma: Secondary | ICD-10-CM | POA: Diagnosis not present

## 2024-04-15 DIAGNOSIS — M8580 Other specified disorders of bone density and structure, unspecified site: Secondary | ICD-10-CM | POA: Diagnosis not present

## 2024-04-15 DIAGNOSIS — M8589 Other specified disorders of bone density and structure, multiple sites: Secondary | ICD-10-CM | POA: Diagnosis not present

## 2024-04-15 DIAGNOSIS — M12811 Other specific arthropathies, not elsewhere classified, right shoulder: Secondary | ICD-10-CM | POA: Diagnosis not present

## 2024-04-15 DIAGNOSIS — E559 Vitamin D deficiency, unspecified: Secondary | ICD-10-CM | POA: Diagnosis not present

## 2024-04-15 DIAGNOSIS — M1990 Unspecified osteoarthritis, unspecified site: Secondary | ICD-10-CM | POA: Diagnosis not present

## 2024-04-15 DIAGNOSIS — M889 Osteitis deformans of unspecified bone: Secondary | ICD-10-CM | POA: Diagnosis not present

## 2024-04-15 DIAGNOSIS — M25511 Pain in right shoulder: Secondary | ICD-10-CM | POA: Diagnosis not present

## 2024-04-15 DIAGNOSIS — M79675 Pain in left toe(s): Secondary | ICD-10-CM | POA: Diagnosis not present

## 2024-04-15 DIAGNOSIS — M719 Bursopathy, unspecified: Secondary | ICD-10-CM | POA: Diagnosis not present

## 2024-04-15 DIAGNOSIS — Z23 Encounter for immunization: Secondary | ICD-10-CM | POA: Diagnosis not present

## 2024-04-22 DIAGNOSIS — M810 Age-related osteoporosis without current pathological fracture: Secondary | ICD-10-CM | POA: Diagnosis not present

## 2024-05-07 DIAGNOSIS — E782 Mixed hyperlipidemia: Secondary | ICD-10-CM | POA: Diagnosis not present

## 2024-05-07 DIAGNOSIS — I110 Hypertensive heart disease with heart failure: Secondary | ICD-10-CM | POA: Diagnosis not present

## 2024-05-07 DIAGNOSIS — E1169 Type 2 diabetes mellitus with other specified complication: Secondary | ICD-10-CM | POA: Diagnosis not present

## 2024-05-07 DIAGNOSIS — J45991 Cough variant asthma: Secondary | ICD-10-CM | POA: Diagnosis not present

## 2024-05-15 DIAGNOSIS — M25511 Pain in right shoulder: Secondary | ICD-10-CM | POA: Diagnosis not present

## 2024-05-28 DIAGNOSIS — M25511 Pain in right shoulder: Secondary | ICD-10-CM | POA: Diagnosis not present

## 2024-05-31 DIAGNOSIS — M25511 Pain in right shoulder: Secondary | ICD-10-CM | POA: Diagnosis not present

## 2024-06-05 DIAGNOSIS — K219 Gastro-esophageal reflux disease without esophagitis: Secondary | ICD-10-CM | POA: Diagnosis not present

## 2024-06-05 DIAGNOSIS — J301 Allergic rhinitis due to pollen: Secondary | ICD-10-CM | POA: Diagnosis not present

## 2024-06-05 DIAGNOSIS — E041 Nontoxic single thyroid nodule: Secondary | ICD-10-CM | POA: Diagnosis not present

## 2024-06-05 DIAGNOSIS — E1169 Type 2 diabetes mellitus with other specified complication: Secondary | ICD-10-CM | POA: Diagnosis not present

## 2024-06-05 DIAGNOSIS — J45991 Cough variant asthma: Secondary | ICD-10-CM | POA: Diagnosis not present

## 2024-06-05 DIAGNOSIS — Z0000 Encounter for general adult medical examination without abnormal findings: Secondary | ICD-10-CM | POA: Diagnosis not present

## 2024-06-05 DIAGNOSIS — E782 Mixed hyperlipidemia: Secondary | ICD-10-CM | POA: Diagnosis not present

## 2024-06-05 DIAGNOSIS — I110 Hypertensive heart disease with heart failure: Secondary | ICD-10-CM | POA: Diagnosis not present

## 2024-06-05 DIAGNOSIS — M889 Osteitis deformans of unspecified bone: Secondary | ICD-10-CM | POA: Diagnosis not present

## 2024-06-05 DIAGNOSIS — I519 Heart disease, unspecified: Secondary | ICD-10-CM | POA: Diagnosis not present
